# Patient Record
Sex: Female | Born: 1970
Health system: Southern US, Community
[De-identification: ages and names within clinical notes are randomized; demographics above are authoritative.]

## PROBLEM LIST (undated history)

## (undated) DIAGNOSIS — T4145XA Adverse effect of unspecified anesthetic, initial encounter: Secondary | ICD-10-CM

## (undated) DIAGNOSIS — D242 Benign neoplasm of left breast: Secondary | ICD-10-CM

## (undated) DIAGNOSIS — I10 Essential (primary) hypertension: Secondary | ICD-10-CM

## (undated) DIAGNOSIS — T8859XA Other complications of anesthesia, initial encounter: Secondary | ICD-10-CM

## (undated) DIAGNOSIS — D241 Benign neoplasm of right breast: Secondary | ICD-10-CM

## (undated) HISTORY — PX: BREAST SURGERY: SHX581

## (undated) HISTORY — DX: Essential (primary) hypertension: I10

## (undated) HISTORY — PX: BREAST BIOPSY: SHX20

---

## 2015-01-25 DIAGNOSIS — N6011 Diffuse cystic mastopathy of right breast: Secondary | ICD-10-CM | POA: Insufficient documentation

## 2015-01-25 DIAGNOSIS — N6012 Diffuse cystic mastopathy of left breast: Secondary | ICD-10-CM

## 2015-02-19 ENCOUNTER — Other Ambulatory Visit: Payer: Self-pay | Admitting: Internal Medicine

## 2015-02-19 DIAGNOSIS — N649 Disorder of breast, unspecified: Secondary | ICD-10-CM

## 2015-03-15 ENCOUNTER — Other Ambulatory Visit: Payer: Self-pay | Admitting: Internal Medicine

## 2015-03-15 ENCOUNTER — Ambulatory Visit
Admission: RE | Admit: 2015-03-15 | Discharge: 2015-03-15 | Disposition: A | Discharge status: Home / Self Care | Payer: 59 | Source: Ambulatory Visit | Attending: Internal Medicine | Admitting: Internal Medicine

## 2015-03-15 DIAGNOSIS — N649 Disorder of breast, unspecified: Secondary | ICD-10-CM

## 2015-03-21 ENCOUNTER — Other Ambulatory Visit: Payer: Self-pay | Admitting: Internal Medicine

## 2015-03-21 DIAGNOSIS — N649 Disorder of breast, unspecified: Secondary | ICD-10-CM

## 2015-03-29 ENCOUNTER — Ambulatory Visit
Admission: RE | Admit: 2015-03-29 | Discharge: 2015-03-29 | Disposition: A | Discharge status: Home / Self Care | Payer: 59 | Source: Ambulatory Visit | Attending: Internal Medicine | Admitting: Internal Medicine

## 2015-03-29 ENCOUNTER — Other Ambulatory Visit: Payer: Self-pay | Admitting: Internal Medicine

## 2015-03-29 DIAGNOSIS — N649 Disorder of breast, unspecified: Secondary | ICD-10-CM

## 2015-06-25 ENCOUNTER — Other Ambulatory Visit: Payer: Self-pay | Admitting: Psychiatry

## 2015-06-25 ENCOUNTER — Other Ambulatory Visit: Payer: Self-pay | Admitting: Internal Medicine

## 2015-06-25 DIAGNOSIS — N63 Unspecified lump in unspecified breast: Secondary | ICD-10-CM

## 2015-07-05 ENCOUNTER — Ambulatory Visit
Admission: RE | Admit: 2015-07-05 | Discharge: 2015-07-05 | Disposition: A | Discharge status: Home / Self Care | Payer: 59 | Source: Ambulatory Visit | Attending: Psychiatry | Admitting: Psychiatry

## 2015-07-05 ENCOUNTER — Ambulatory Visit
Admission: RE | Admit: 2015-07-05 | Discharge: 2015-07-05 | Disposition: A | Discharge status: Home / Self Care | Payer: 59 | Source: Ambulatory Visit | Attending: Internal Medicine | Admitting: Internal Medicine

## 2015-07-05 DIAGNOSIS — N63 Unspecified lump in unspecified breast: Secondary | ICD-10-CM

## 2015-08-30 ENCOUNTER — Other Ambulatory Visit: Payer: Self-pay | Admitting: Internal Medicine

## 2015-08-30 DIAGNOSIS — N6310 Unspecified lump in the right breast, unspecified quadrant: Secondary | ICD-10-CM

## 2015-09-20 ENCOUNTER — Other Ambulatory Visit: Payer: Self-pay | Admitting: Internal Medicine

## 2015-09-20 ENCOUNTER — Ambulatory Visit
Admission: RE | Admit: 2015-09-20 | Discharge: 2015-09-20 | Disposition: A | Discharge status: Home / Self Care | Payer: 59 | Source: Ambulatory Visit | Attending: Internal Medicine | Admitting: Internal Medicine

## 2015-09-20 DIAGNOSIS — N6310 Unspecified lump in the right breast, unspecified quadrant: Secondary | ICD-10-CM

## 2015-10-11 ENCOUNTER — Ambulatory Visit
Admission: RE | Admit: 2015-10-11 | Discharge: 2015-10-11 | Disposition: A | Discharge status: Home / Self Care | Payer: 59 | Source: Ambulatory Visit | Attending: Internal Medicine | Admitting: Internal Medicine

## 2015-10-11 ENCOUNTER — Other Ambulatory Visit: Payer: Self-pay | Admitting: Internal Medicine

## 2015-10-11 DIAGNOSIS — N6310 Unspecified lump in the right breast, unspecified quadrant: Secondary | ICD-10-CM

## 2015-11-08 DIAGNOSIS — D241 Benign neoplasm of right breast: Secondary | ICD-10-CM | POA: Diagnosis not present

## 2015-11-08 DIAGNOSIS — D242 Benign neoplasm of left breast: Secondary | ICD-10-CM | POA: Diagnosis not present

## 2015-11-23 DIAGNOSIS — H524 Presbyopia: Secondary | ICD-10-CM | POA: Diagnosis not present

## 2015-11-23 DIAGNOSIS — H52223 Regular astigmatism, bilateral: Secondary | ICD-10-CM | POA: Diagnosis not present

## 2015-11-23 DIAGNOSIS — H5213 Myopia, bilateral: Secondary | ICD-10-CM | POA: Diagnosis not present

## 2016-01-10 DIAGNOSIS — I83813 Varicose veins of bilateral lower extremities with pain: Secondary | ICD-10-CM | POA: Diagnosis not present

## 2016-01-13 ENCOUNTER — Ambulatory Visit: Payer: Self-pay | Admitting: Surgery

## 2016-01-13 DIAGNOSIS — D241 Benign neoplasm of right breast: Secondary | ICD-10-CM | POA: Diagnosis not present

## 2016-01-13 DIAGNOSIS — D242 Benign neoplasm of left breast: Secondary | ICD-10-CM | POA: Diagnosis not present

## 2016-01-13 NOTE — H&P (Signed)
History of Present Illness Shelley Davila. Shelley Geffrard MD; 01/13/2016 9:39 AM) Patient words: eval palpable mass.  The patient is a 45 year old female who presents with a breast mass. Three month follow-up for right breast masses. She has had at least 4 previous excisional biopsies and each one turned out to be a fibroadenoma. She has had multiple other core needle biopsies and has several biopsy clips in place on each side. She was examined June of 2016 and had a small left-sided fibroadenoma. This has not really changed. However in September, she was able to palpate a small mass just under the skin of her right nipple. This was biopsied on 10/11/15 and the pathology showed fibroadenoma versus a low-grade phyllodes tumor. She had another mass at 7:00 in the same breast that was palpated at the last visit. We decided to recheck in 3 months and rather than perform another lumpectomy. If there has been any enlargement that we will consider lumpectomy.  Menarche - 60 First pregnancy - 26 Breastfeed - yes Family history - none OCP - 10-12 years   CLINICAL DATA: Patient complains of an enlarging mass in the 1 o'clock region of the right breast. History of numerous fibroadenomas biopsied in the right breast and fibrocystic change biopsied from the left breast.  EXAM: DIGITAL DIAGNOSTIC BILATERAL MAMMOGRAM WITH 3D TOMOSYNTHESIS WITH CAD  ULTRASOUND BILATERAL BREAST  COMPARISON: Previous exam(s).  ACR Breast Density Category d: The breast tissue is extremely dense, which lowers the sensitivity of mammography.  FINDINGS: There is a developing mass in the anterior aspect of the upper-outer quadrant of the right breast corresponding with the palpable abnormality. No additional masses are seen in the right breast. There are no malignant type microcalcifications in either breast. The asymmetric fibroglandular tissue in the superior aspect of the left breast is stable when compared to prior exam  dated 09/20/2014.  Mammographic images were processed with CAD.  On physical exam, I palpate a freely mobile mass in the right breast at 11 o'clock 1 cm from the nipple. I do not palpate a mass in the superior aspect of the left breast.  Targeted ultrasound is performed, showing a well-circumscribed hypoechoic mass in the right breast at 11 o'clock 1 cm from the nipple measuring 9 x 8 x 10 mm. On the prior ultrasound dated 07/05/2015 it measured 6 x 6 x 6 mm. Sonographic evaluation of the right axilla does not show any enlarged adenopathy. Sonographic evaluation of the superior aspect of the left breast shows an anechoic cyst at 2 o'clock 3 cm from the nipple measuring 5 x 4 x 5 mm. No solid mass or abnormal shadowing detected.  IMPRESSION: Developing mass in the 11 o'clock region of the right breast.  RECOMMENDATION: Ultrasound-guided core biopsy of the right breast mass is recommended. This will be scheduled at the patient's convenience.  I have discussed the findings and recommendations with the patient. Results were also provided in writing at the conclusion of the visit. If applicable, a reminder letter will be sent to the patient regarding the next appointment.  BI-RADS CATEGORY 4: Suspicious.   Electronically Signed By: Lillia Mountain M.D. On: 09/20/2015 16:52  CLINICAL DATA: Patient presents for ultrasound-guided core needle biopsy of a palpable right breast mass, which has significantly enlarged. She has a history of multiple previous biopsies both excisions and needle biopsies, revealing multiple bilateral fibroadenomas.  EXAM: ULTRASOUND GUIDED RIGHT BREAST CORE NEEDLE BIOPSY  COMPARISON: Previous exam(s).  FINDINGS: I met with the patient  and we discussed the procedure of ultrasound-guided biopsy, including benefits and alternatives. We discussed the high likelihood of a successful procedure. We discussed the risks of the procedure, including infection,  bleeding, tissue injury, clip migration, and inadequate sampling. Informed written consent was given. The usual time-out protocol was performed immediately prior to the procedure.  Using sterile technique and 2% Lidocaine as local anesthetic, under direct ultrasound visualization, a 14 gauge spring-loaded device was used to perform biopsy of the small right breast mass using a inferior-lateral approach. At the conclusion of the procedure a coil shaped tissue marker clip was deployed into the biopsy cavity. Follow up 2 view mammogram was performed and dictated separately.  IMPRESSION: Ultrasound guided biopsy of right breast mass. No apparent complications.  Electronically Signed: By: Lajean Manes M.D. On: 10/11/2015 15:59   Diagnosis Breast, right, needle core biopsy, 11 o'clock near nipple - BIPHASIC EPITHELIAL / STROMAL LESION. - USUAL DUCTAL HYPERPLASIA. - SEE COMMENT. Microscopic Comment The differential diagnosis includes a fibroadenoma versus a low grade phyllodes tumor. The findings are called to the [facility] on 10/15/15. Dr. Lyndon Code has seen this case in consultation with agreement. (RH:ds 10/15/15) [name] MD Pathologist, Electronic Signature (Case signed 10/15/2015)   Allergies (Ammie Eversole, LPN; 10/17/1094 0:45 AM) No Known Drug Allergies 11/08/2015  Medication History (Ammie Eversole, LPN; 4/0/9811 9:14 AM) No Current Medications Medications Reconciled    Vitals (Ammie Eversole LPN; 04/19/2955 2:13 AM) 01/13/2016 8:29 AM Weight: 137.4 lb Height: 67in Body Surface Area: 1.72 m Body Mass Index: 21.52 kg/m  Temp.: 98.15F(Oral)  Pulse: 72 (Regular)  BP: 102/70 (Sitting, Left Arm, Standard)      Physical Exam Rodman Key K. Selyna Klahn MD; 01/13/2016 9:40 AM)  The physical exam findings are as follows: Note:WDWN in NAD HEENT: EOMI, sclera anicteric Neck: No masses, no thyromegaly Lungs: CTA bilaterally; normal respiratory  effort Breasts: symmetric; slight ptosis; right retroareolar mass - larger - now measuring about 1.5 cm with some extension laterally; right under the skin Right breast 7:00 - 1 cm oval-shaped mass; no change from last visit CV: Regular rate and rhythm; no murmurs Abd: +bowel sounds, soft, non-tender, no masses Ext: Well-perfused; no edema Skin: Warm, dry; no sign of jaundice    Assessment & Plan Rodman Key K. Genean Adamski MD; 01/13/2016 9:05 AM)  MULTIPLE FIBROADENOMAS OF BOTH BREASTS (D24.1)  Current Plans Schedule for Surgery - Excision of two fibroadenomas - right breast (11:00 and 7:00). The surgical procedure has been discussed with the patient. Potential risks, benefits, alternative treatments, and expected outcomes have been explained. All of the patient's questions at this time have been answered. The likelihood of reaching the patient's treatment goal is good. The patient understand the proposed surgical procedure and wishes to proceed.  Shelley Davila. Georgette Dover, MD, [facility] Surgery  General/ Trauma Surgery  01/13/2016 9:43 AM

## 2016-02-28 ENCOUNTER — Encounter (HOSPITAL_BASED_OUTPATIENT_CLINIC_OR_DEPARTMENT_OTHER): Payer: Self-pay | Admitting: *Deleted

## 2016-03-03 NOTE — Pre-Procedure Instructions (Signed)
Pt in to pick up Boost Breeze. Instructions reviewed with pt.

## 2016-03-05 ENCOUNTER — Ambulatory Visit (HOSPITAL_BASED_OUTPATIENT_CLINIC_OR_DEPARTMENT_OTHER)
Admission: RE | Admit: 2016-03-05 | Discharge: 2016-03-05 | Disposition: A | Discharge status: Home / Self Care | Payer: 59 | Source: Ambulatory Visit | Attending: Surgery | Admitting: Surgery

## 2016-03-05 ENCOUNTER — Encounter (HOSPITAL_BASED_OUTPATIENT_CLINIC_OR_DEPARTMENT_OTHER): Payer: Self-pay

## 2016-03-05 ENCOUNTER — Ambulatory Visit (HOSPITAL_BASED_OUTPATIENT_CLINIC_OR_DEPARTMENT_OTHER): Payer: 59 | Admitting: Anesthesiology

## 2016-03-05 ENCOUNTER — Encounter (HOSPITAL_BASED_OUTPATIENT_CLINIC_OR_DEPARTMENT_OTHER)
Admission: RE | Disposition: A | Discharge status: Home / Self Care | Payer: Self-pay | Source: Ambulatory Visit | Attending: Surgery

## 2016-03-05 DIAGNOSIS — D241 Benign neoplasm of right breast: Secondary | ICD-10-CM | POA: Insufficient documentation

## 2016-03-05 DIAGNOSIS — N6081 Other benign mammary dysplasias of right breast: Secondary | ICD-10-CM | POA: Diagnosis not present

## 2016-03-05 DIAGNOSIS — N6011 Diffuse cystic mastopathy of right breast: Secondary | ICD-10-CM | POA: Diagnosis not present

## 2016-03-05 HISTORY — DX: Adverse effect of unspecified anesthetic, initial encounter: T41.45XA

## 2016-03-05 HISTORY — DX: Benign neoplasm of right breast: D24.1

## 2016-03-05 HISTORY — PX: EXCISION OF BREAST BIOPSY: SHX5822

## 2016-03-05 HISTORY — DX: Benign neoplasm of right breast: D24.2

## 2016-03-05 HISTORY — DX: Other complications of anesthesia, initial encounter: T88.59XA

## 2016-03-05 SURGERY — EXCISION OF BREAST BIOPSY
Anesthesia: General | Site: Breast | Laterality: Right

## 2016-03-05 MED ORDER — DEXAMETHASONE SODIUM PHOSPHATE 4 MG/ML IJ SOLN
INTRAMUSCULAR | Status: DC | PRN
  Administered 2016-03-05: 10 mg via INTRAVENOUS

## 2016-03-05 MED ORDER — BUPIVACAINE-EPINEPHRINE 0.25% -1:200000 IJ SOLN
INTRAMUSCULAR | Status: DC | PRN
  Administered 2016-03-05: 7 mL

## 2016-03-05 MED ORDER — CEFAZOLIN SODIUM-DEXTROSE 2-4 GM/100ML-% IV SOLN
INTRAVENOUS | Status: AC
  Filled 2016-03-05: qty 100

## 2016-03-05 MED ORDER — SCOPOLAMINE 1 MG/3DAYS TD PT72: 1.0000 | MEDICATED_PATCH | Freq: Once | TRANSDERMAL | Status: DC | PRN

## 2016-03-05 MED ORDER — MIDAZOLAM HCL 2 MG/2ML IJ SOLN: 1.0000 mg | INTRAMUSCULAR | Status: DC | PRN

## 2016-03-05 MED ORDER — GLYCOPYRROLATE 0.2 MG/ML IV SOSY
PREFILLED_SYRINGE | INTRAVENOUS | Status: AC
  Filled 2016-03-05: qty 3

## 2016-03-05 MED ORDER — GLYCOPYRROLATE 0.2 MG/ML IJ SOLN: 0.2000 mg | Freq: Once | INTRAMUSCULAR | Status: DC | PRN

## 2016-03-05 MED ORDER — ONDANSETRON HCL 4 MG/2ML IJ SOLN
INTRAMUSCULAR | Status: DC | PRN
  Administered 2016-03-05: 4 mg via INTRAVENOUS

## 2016-03-05 MED ORDER — MEPERIDINE HCL 25 MG/ML IJ SOLN: 6.2500 mg | INTRAMUSCULAR | Status: DC | PRN

## 2016-03-05 MED ORDER — BUPIVACAINE-EPINEPHRINE (PF) 0.25% -1:200000 IJ SOLN
INTRAMUSCULAR | Status: AC
  Filled 2016-03-05: qty 60

## 2016-03-05 MED ORDER — FENTANYL CITRATE (PF) 100 MCG/2ML IJ SOLN
INTRAMUSCULAR | Status: DC | PRN
  Administered 2016-03-05 (×2): 50 ug via INTRAVENOUS

## 2016-03-05 MED ORDER — HYDROMORPHONE HCL 1 MG/ML IJ SOLN: 0.2500 mg | INTRAMUSCULAR | Status: DC | PRN

## 2016-03-05 MED ORDER — SCOPOLAMINE 1 MG/3DAYS TD PT72
MEDICATED_PATCH | TRANSDERMAL | Status: AC
  Filled 2016-03-05: qty 1

## 2016-03-05 MED ORDER — LIDOCAINE HCL (PF) 1 % IJ SOLN
INTRAMUSCULAR | Status: AC
  Filled 2016-03-05: qty 30

## 2016-03-05 MED ORDER — LIDOCAINE HCL (CARDIAC) 20 MG/ML IV SOLN
INTRAVENOUS | Status: DC | PRN
  Administered 2016-03-05: 50 mg via INTRAVENOUS

## 2016-03-05 MED ORDER — PHENYLEPHRINE HCL 10 MG/ML IJ SOLN
INTRAMUSCULAR | Status: DC | PRN
  Administered 2016-03-05: 80 ug via INTRAVENOUS

## 2016-03-05 MED ORDER — MIDAZOLAM HCL 5 MG/5ML IJ SOLN
INTRAMUSCULAR | Status: DC | PRN
  Administered 2016-03-05: 2 mg via INTRAVENOUS

## 2016-03-05 MED ORDER — PROPOFOL 10 MG/ML IV BOLUS
INTRAVENOUS | Status: DC | PRN
  Administered 2016-03-05: 200 mg via INTRAVENOUS
  Administered 2016-03-05: 100 mg via INTRAVENOUS

## 2016-03-05 MED ORDER — LACTATED RINGERS IV SOLN
INTRAVENOUS | Status: DC
  Administered 2016-03-05 (×2): via INTRAVENOUS

## 2016-03-05 MED ORDER — ONDANSETRON HCL 4 MG/2ML IJ SOLN: 4.0000 mg | INTRAMUSCULAR | Status: DC | PRN

## 2016-03-05 MED ORDER — CEFAZOLIN SODIUM-DEXTROSE 2-4 GM/100ML-% IV SOLN
2.0000 g | INTRAVENOUS | Status: AC
  Administered 2016-03-05: 2 g via INTRAVENOUS

## 2016-03-05 MED ORDER — LIDOCAINE 2% (20 MG/ML) 5 ML SYRINGE
INTRAMUSCULAR | Status: AC
  Filled 2016-03-05: qty 5

## 2016-03-05 MED ORDER — SODIUM BICARBONATE 4 % IV SOLN
INTRAVENOUS | Status: AC
  Filled 2016-03-05: qty 5

## 2016-03-05 MED ORDER — HYDROCODONE-ACETAMINOPHEN 5-325 MG PO TABS: 1.0000 | ORAL_TABLET | ORAL | Status: DC | PRN

## 2016-03-05 MED ORDER — GLYCOPYRROLATE 0.2 MG/ML IJ SOLN
INTRAMUSCULAR | Status: DC | PRN
  Administered 2016-03-05: 0.2 mg via INTRAVENOUS

## 2016-03-05 MED ORDER — METHYLENE BLUE 0.5 % INJ SOLN
INTRAVENOUS | Status: AC
  Filled 2016-03-05: qty 10

## 2016-03-05 MED ORDER — SCOPOLAMINE 1 MG/3DAYS TD PT72
MEDICATED_PATCH | TRANSDERMAL | Status: DC | PRN
  Administered 2016-03-05: 1 via TRANSDERMAL

## 2016-03-05 MED ORDER — ONDANSETRON HCL 4 MG/2ML IJ SOLN
INTRAMUSCULAR | Status: AC
  Filled 2016-03-05: qty 2

## 2016-03-05 MED ORDER — MIDAZOLAM HCL 2 MG/2ML IJ SOLN
INTRAMUSCULAR | Status: AC
  Filled 2016-03-05: qty 2

## 2016-03-05 MED ORDER — FENTANYL CITRATE (PF) 100 MCG/2ML IJ SOLN: 50.0000 ug | INTRAMUSCULAR | Status: DC | PRN

## 2016-03-05 MED ORDER — MORPHINE SULFATE (PF) 2 MG/ML IV SOLN: 2.0000 mg | INTRAVENOUS | Status: DC | PRN

## 2016-03-05 MED ORDER — DEXAMETHASONE SODIUM PHOSPHATE 10 MG/ML IJ SOLN
INTRAMUSCULAR | Status: AC
  Filled 2016-03-05: qty 1

## 2016-03-05 MED ORDER — OXYCODONE HCL 5 MG/5ML PO SOLN: 5.0000 mg | Freq: Once | ORAL | Status: DC | PRN

## 2016-03-05 MED ORDER — CHLORHEXIDINE GLUCONATE 4 % EX LIQD: 1.0000 "application " | Freq: Once | CUTANEOUS | Status: DC

## 2016-03-05 MED ORDER — PROPOFOL 10 MG/ML IV BOLUS
INTRAVENOUS | Status: AC
  Filled 2016-03-05: qty 40

## 2016-03-05 MED ORDER — SODIUM CHLORIDE 0.9 % IJ SOLN
INTRAMUSCULAR | Status: AC
  Filled 2016-03-05: qty 10

## 2016-03-05 MED ORDER — PHENYLEPHRINE 40 MCG/ML (10ML) SYRINGE FOR IV PUSH (FOR BLOOD PRESSURE SUPPORT)
PREFILLED_SYRINGE | INTRAVENOUS | Status: AC
  Filled 2016-03-05: qty 10

## 2016-03-05 MED ORDER — OXYCODONE HCL 5 MG PO TABS: 5.0000 mg | ORAL_TABLET | Freq: Once | ORAL | Status: DC | PRN

## 2016-03-05 MED ORDER — BUPIVACAINE HCL (PF) 0.5 % IJ SOLN
INTRAMUSCULAR | Status: AC
  Filled 2016-03-05: qty 30

## 2016-03-05 MED ORDER — FENTANYL CITRATE (PF) 100 MCG/2ML IJ SOLN
INTRAMUSCULAR | Status: AC
  Filled 2016-03-05: qty 2

## 2016-03-05 MED FILL — HYDROCODON-APAP 5-325: 5-325 | 7 days supply | Qty: 40 | Fill #0

## 2016-03-05 SURGICAL SUPPLY — 45 items
APPLICATOR COTTON TIP 6IN STRL (MISCELLANEOUS) IMPLANT
APPLIER CLIP 9.375 MED OPEN (MISCELLANEOUS)
BENZOIN TINCTURE PRP APPL 2/3 (GAUZE/BANDAGES/DRESSINGS) ×2 IMPLANT
BLADE HEX COATED 2.75 (ELECTRODE) ×2 IMPLANT
BLADE SURG 15 STRL LF DISP TIS (BLADE) ×1 IMPLANT
BLADE SURG 15 STRL SS (BLADE) ×1
CANISTER SUCT 1200ML W/VALVE (MISCELLANEOUS) ×2 IMPLANT
CHLORAPREP W/TINT 26ML (MISCELLANEOUS) ×2 IMPLANT
CLIP APPLIE 9.375 MED OPEN (MISCELLANEOUS) IMPLANT
COVER BACK TABLE 60X90IN (DRAPES) ×2 IMPLANT
COVER MAYO STAND STRL (DRAPES) ×2 IMPLANT
DECANTER SPIKE VIAL GLASS SM (MISCELLANEOUS) ×2 IMPLANT
DEVICE DUBIN W/COMP PLATE 8390 (MISCELLANEOUS) IMPLANT
DRAPE LAPAROTOMY 100X72 PEDS (DRAPES) ×2 IMPLANT
DRAPE UTILITY XL STRL (DRAPES) ×2 IMPLANT
DRSG TEGADERM 4X4.75 (GAUZE/BANDAGES/DRESSINGS) ×2 IMPLANT
ELECT REM PT RETURN 9FT ADLT (ELECTROSURGICAL) ×2
ELECTRODE REM PT RTRN 9FT ADLT (ELECTROSURGICAL) ×1 IMPLANT
GLOVE BIO SURGEON STRL SZ 6.5 (GLOVE) ×2 IMPLANT
GLOVE BIO SURGEON STRL SZ7 (GLOVE) ×2 IMPLANT
GLOVE BIOGEL PI IND STRL 7.5 (GLOVE) ×2 IMPLANT
GLOVE BIOGEL PI INDICATOR 7.5 (GLOVE) ×2
GLOVE EXAM NITRILE EXT CUFF MD (GLOVE) ×2 IMPLANT
GLOVE SURG SS PI 7.0 STRL IVOR (GLOVE) ×4 IMPLANT
GOWN STRL REUS W/ TWL LRG LVL3 (GOWN DISPOSABLE) ×2 IMPLANT
GOWN STRL REUS W/TWL LRG LVL3 (GOWN DISPOSABLE) ×2
KIT MARKER MARGIN INK (KITS) ×2 IMPLANT
NEEDLE HYPO 25X1 1.5 SAFETY (NEEDLE) ×2 IMPLANT
NS IRRIG 1000ML POUR BTL (IV SOLUTION) ×2 IMPLANT
PACK BASIN DAY SURGERY FS (CUSTOM PROCEDURE TRAY) ×2 IMPLANT
PENCIL BUTTON HOLSTER BLD 10FT (ELECTRODE) ×2 IMPLANT
SLEEVE SCD COMPRESS KNEE MED (MISCELLANEOUS) ×2 IMPLANT
SPONGE GAUZE 4X4 12PLY STER LF (GAUZE/BANDAGES/DRESSINGS) ×2 IMPLANT
SPONGE LAP 4X18 X RAY DECT (DISPOSABLE) ×2 IMPLANT
STRIP CLOSURE SKIN 1/2X4 (GAUZE/BANDAGES/DRESSINGS) ×2 IMPLANT
SUT CHROMIC 3 0 SH 27 (SUTURE) IMPLANT
SUT MON AB 4-0 PC3 18 (SUTURE) ×2 IMPLANT
SUT SILK 2 0 SH (SUTURE) IMPLANT
SUT VIC AB 3-0 SH 27 (SUTURE) ×1
SUT VIC AB 3-0 SH 27X BRD (SUTURE) ×1 IMPLANT
SYR CONTROL 10ML LL (SYRINGE) ×2 IMPLANT
TOWEL OR 17X24 6PK STRL BLUE (TOWEL DISPOSABLE) ×2 IMPLANT
TOWEL OR NON WOVEN STRL DISP B (DISPOSABLE) ×2 IMPLANT
TUBE CONNECTING 20X1/4 (TUBING) ×2 IMPLANT
YANKAUER SUCT BULB TIP NO VENT (SUCTIONS) ×2 IMPLANT

## 2016-03-05 NOTE — Discharge Instructions (Signed)
[facility] °Office Phone Number [phone] ° °BREAST BIOPSY/ PARTIAL MASTECTOMY: POST OP INSTRUCTIONS ° °Always review your discharge instruction sheet given to you by the facility where your surgery was performed. ° °IF YOU HAVE DISABILITY OR FAMILY LEAVE FORMS, YOU MUST BRING THEM TO THE OFFICE FOR PROCESSING.  DO NOT GIVE THEM TO YOUR DOCTOR. ° °1. A prescription for pain medication may be given to you upon discharge.  Take your pain medication as prescribed, if needed.  If narcotic pain medicine is not needed, then you may take acetaminophen (Tylenol) or ibuprofen (Advil) as needed. °2. Take your usually prescribed medications unless otherwise directed °3. If you need a refill on your pain medication, please contact your pharmacy.  They will contact our office to request authorization.  Prescriptions will not be filled after 5pm or on week-ends. °4. You should eat very light the first 24 hours after surgery, such as soup, crackers, pudding, etc.  Resume your normal diet the day after surgery. °5. Most patients will experience some swelling and bruising in the breast.  Ice packs and a good support bra will help.  Swelling and bruising can take several days to resolve.  °6. It is common to experience some constipation if taking pain medication after surgery.  Increasing fluid intake and taking a stool softener will usually help or prevent this problem from occurring.  A mild laxative (Milk of Magnesia or Miralax) should be taken according to package directions if there are no bowel movements after 48 hours. °7. Unless discharge instructions indicate otherwise, you may remove your bandages 48 hours after surgery, and you may shower at that time.  You will have steri-strips (small skin tapes) in place directly over the incision.  These strips should be left on the skin for 7-10 days.   Any sutures or staples will be removed at the office during your follow-up visit. °8. ACTIVITIES:  You may resume  regular daily activities (gradually increasing) beginning the next day.  Wearing a good support bra or sports bra minimizes pain and swelling.  You may have sexual intercourse when it is comfortable. °a. You may drive when you no longer are taking prescription pain medication, you can comfortably wear a seatbelt, and you can safely maneuver your car and apply brakes. °b. RETURN TO WORK:  1-2 weeks °9. You should see your doctor in the office for a follow-up appointment approximately two weeks after your surgery.  Your doctor’s nurse will typically make your follow-up appointment when she calls you with your pathology report.  Expect your pathology report 2-3 business days after your surgery.  You may call to check if you do not hear from us after three days. °10. OTHER INSTRUCTIONS: _______________________________________________________________________________________________ _____________________________________________________________________________________________________________________________________ °_____________________________________________________________________________________________________________________________________ °_____________________________________________________________________________________________________________________________________ ° °WHEN TO CALL YOUR DOCTOR: °1. Fever over 101.0 °2. Nausea and/or vomiting. °3. Extreme swelling or bruising. °4. Continued bleeding from incision. °5. Increased pain, redness, or drainage from the incision. ° °The clinic staff is available to answer your questions during regular business hours.  Please don’t hesitate to call and ask to speak to one of the nurses for clinical concerns.  If you have a medical emergency, go to the nearest emergency room or call 911.  A surgeon from [facility] is always on call at the hospital. ° °For further questions, please visit [email or web address]  ° ° ° °Post Anesthesia Home Care  Instructions ° °Activity: °Get plenty of rest for the remainder of the day. A responsible adult should stay   with you for 24 hours following the procedure.  °For the next 24 hours, DO NOT: °-Drive a car °-Operate machinery °-Drink alcoholic beverages °-Take any medication unless instructed by your physician °-Make any legal decisions or sign important papers. ° °Meals: °Start with liquid foods such as gelatin or soup. Progress to regular foods as tolerated. Avoid greasy, spicy, heavy foods. If nausea and/or vomiting occur, drink only clear liquids until the nausea and/or vomiting subsides. Call your physician if vomiting continues. ° °Special Instructions/Symptoms: °Your throat may feel dry or sore from the anesthesia or the breathing tube placed in your throat during surgery. If this causes discomfort, gargle with warm salt water. The discomfort should disappear within 24 hours. ° °If you had a scopolamine patch placed behind your ear for the management of post- operative nausea and/or vomiting: ° °1. The medication in the patch is effective for 72 hours, after which it should be removed.  Wrap patch in a tissue and discard in the trash. Wash hands thoroughly with soap and water. °2. You may remove the patch earlier than 72 hours if you experience unpleasant side effects which may include dry mouth, dizziness or visual disturbances. °3. Avoid touching the patch. Wash your hands with soap and water after contact with the patch. °  ° °

## 2016-03-05 NOTE — Op Note (Signed)
Preop diagnosis: Multiple right breast fibroadenomas Postop diagnosis: Same Procedure performed: Excision of 2 right breast fibroadenomas Surgeon:Braydee Shimkus K. Anesthesia: Gen. via LMA Indications: This is a 45 year old female who has had multiple excisional biopsies for fibroadenomas. She has developed 2 new areas in the right breast. We have followed his over last several months and they seem to be enlarging. There is one located at 11:00 just at the edge of the areola. There is another at 7:00 about 2 cm lateral to the edge of the areole. We recommended lumpectomy both of these.  Description of procedure: The patient is brought to the operating room and placed in a supine position on the operating room table. After an adequate level of general anesthesia was obtained, her right breast was prepped with ChloraPrep and draped sterile fashion. A timeout was taken to ensure the proper patient and proper procedure. We infiltrated the area around the lateral side of the nipple with 0.25% Marcaine with epinephrine. She has a previous circumareolar incision. We open this with the scalpel. Dissection was carried down in the breast tissue with cautery. We excised the small firm mass that 11:00 with cautery. This was oriented with a paint kit. This was sent for pathologic examination labeled right breast mass 11:00.  We then palpated the mass at 7:00. We dissected down to the surface of this mass. We excised this mass. There seems to be a couple of cyst containing some bluish fluid. There is a small firm mass in the center of this area. We excised this and oriented with a paint kit. This was sent for pathologic examination labeled right breast mass 7:00. We irrigated the wound thoroughly and inspected for hemostasis. The wound was closed with a deep layer of 3-0 Vicryl and a subcuticular layer of 4-0 Monocryl. Steri-Strips and clean dressings were applied. The patient was then extubated and brought to the recovery  room in  stable condition. All sponge, initially, and needle counts are correct.  Imogene Burn. Georgette Dover, MD, [facility]  General/ Trauma Surgery  03/05/2016 8:32 AM

## 2016-03-05 NOTE — H&P (Signed)
History of Present Illness  Patient words: eval palpable mass.  The patient is a 45 year old female who presents with a breast mass. Three month follow-up for right breast masses. She has had at least 4 previous excisional biopsies and each one turned out to be a fibroadenoma. She has had multiple other core needle biopsies and has several biopsy clips in place on each side. She was examined June of 2016 and had a small left-sided fibroadenoma. This has not really changed. However in September, she was able to palpate a small mass just under the skin of her right nipple. This was biopsied on 10/11/15 and the pathology showed fibroadenoma versus a low-grade phyllodes tumor. She had another mass at 7:00 in the same breast that was palpated at the last visit. We decided to recheck in 3 months and rather than perform another lumpectomy. If there has been any enlargement that we will consider lumpectomy.  Menarche - 24 First pregnancy - 26 Breastfeed - yes Family history - none OCP - 10-12 years   CLINICAL DATA: Patient complains of an enlarging mass in the 1 o'clock region of the right breast. History of numerous fibroadenomas biopsied in the right breast and fibrocystic change biopsied from the left breast.  EXAM: DIGITAL DIAGNOSTIC BILATERAL MAMMOGRAM WITH 3D TOMOSYNTHESIS WITH CAD  ULTRASOUND BILATERAL BREAST  COMPARISON: Previous exam(s).  ACR Breast Density Category d: The breast tissue is extremely dense, which lowers the sensitivity of mammography.  FINDINGS: There is a developing mass in the anterior aspect of the upper-outer quadrant of the right breast corresponding with the palpable abnormality. No additional masses are seen in the right breast. There are no malignant type microcalcifications in either breast. The asymmetric fibroglandular tissue in the superior aspect of the left breast is stable when compared to prior exam dated 09/20/2014.  Mammographic  images were processed with CAD.  On physical exam, I palpate a freely mobile mass in the right breast at 11 o'clock 1 cm from the nipple. I do not palpate a mass in the superior aspect of the left breast.  Targeted ultrasound is performed, showing a well-circumscribed hypoechoic mass in the right breast at 11 o'clock 1 cm from the nipple measuring 9 x 8 x 10 mm. On the prior ultrasound dated 07/05/2015 it measured 6 x 6 x 6 mm. Sonographic evaluation of the right axilla does not show any enlarged adenopathy. Sonographic evaluation of the superior aspect of the left breast shows an anechoic cyst at 2 o'clock 3 cm from the nipple measuring 5 x 4 x 5 mm. No solid mass or abnormal shadowing detected.  IMPRESSION: Developing mass in the 11 o'clock region of the right breast.  RECOMMENDATION: Ultrasound-guided core biopsy of the right breast mass is recommended. This will be scheduled at the patient's convenience.  I have discussed the findings and recommendations with the patient. Results were also provided in writing at the conclusion of the visit. If applicable, a reminder letter will be sent to the patient regarding the next appointment.  BI-RADS CATEGORY 4: Suspicious.   Electronically Signed By: Lillia Mountain M.D. On: 09/20/2015 16:52  CLINICAL DATA: Patient presents for ultrasound-guided core needle biopsy of a palpable right breast mass, which has significantly enlarged. She has a history of multiple previous biopsies both excisions and needle biopsies, revealing multiple bilateral fibroadenomas.  EXAM: ULTRASOUND GUIDED RIGHT BREAST CORE NEEDLE BIOPSY  COMPARISON: Previous exam(s).  FINDINGS: I met with the patient and we discussed the procedure of  ultrasound-guided biopsy, including benefits and alternatives. We discussed the high likelihood of a successful procedure. We discussed the risks of the procedure, including infection, bleeding, tissue injury, clip  migration, and inadequate sampling. Informed written consent was given. The usual time-out protocol was performed immediately prior to the procedure.  Using sterile technique and 2% Lidocaine as local anesthetic, under direct ultrasound visualization, a 14 gauge spring-loaded device was used to perform biopsy of the small right breast mass using a inferior-lateral approach. At the conclusion of the procedure a coil shaped tissue marker clip was deployed into the biopsy cavity. Follow up 2 view mammogram was performed and dictated separately.  IMPRESSION: Ultrasound guided biopsy of right breast mass. No apparent complications.  Electronically Signed: By: Lajean Manes M.D. On: 10/11/2015 15:59   Diagnosis Breast, right, needle core biopsy, 11 o'clock near nipple - BIPHASIC EPITHELIAL / STROMAL LESION. - USUAL DUCTAL HYPERPLASIA. - SEE COMMENT. Microscopic Comment The differential diagnosis includes a fibroadenoma versus a low grade phyllodes tumor. The findings are called to the [facility] on 10/15/15. Dr. Lyndon Code has seen this case in consultation with agreement. (RH:ds 10/15/15) [name] MD Pathologist, Electronic Signature (Case signed 10/15/2015)   Allergies  No Known Drug Allergies 11/08/2015  Medication History  No Current Medications Medications Reconciled    Vitals  Weight: 137.4 lb Height: 67in Body Surface Area: 1.72 m Body Mass Index: 21.52 kg/m  Temp.: 98.83F(Oral)  Pulse: 72 (Regular)  BP: 102/70 (Sitting, Left Arm, Standard)      Physical Exam   The physical exam findings are as follows: Note:WDWN in NAD HEENT: EOMI, sclera anicteric Neck: No masses, no thyromegaly Lungs: CTA bilaterally; normal respiratory effort Breasts: symmetric; slight ptosis; right retroareolar mass - larger - now measuring about 1.5 cm with some extension laterally; right under the skin Right breast 7:00 - 1 cm oval-shaped mass; no  change from last visit CV: Regular rate and rhythm; no murmurs Abd: +bowel sounds, soft, non-tender, no masses Ext: Well-perfused; no edema Skin: Warm, dry; no sign of jaundice    Assessment & Plan   MULTIPLE FIBROADENOMAS OF BOTH BREASTS (D24.1)  Current Plans Schedule for Surgery - Excision of two fibroadenomas - right breast (11:00 and 7:00). The surgical procedure has been discussed with the patient. Potential risks, benefits, alternative treatments, and expected outcomes have been explained. All of the patient's questions at this time have been answered. The likelihood of reaching the patient's treatment goal is good. The patient understand the proposed surgical procedure and wishes to proceed.   Imogene Burn. Georgette Dover, MD, [facility] Surgery  General/ Trauma Surgery  03/05/2016 7:11 AM

## 2016-03-05 NOTE — Anesthesia Procedure Notes (Signed)
Procedure Name: LMA Insertion Date/Time: 03/05/2016 7:41 AM Performed by: Toula Moos L Pre-anesthesia Checklist: Patient identified, Emergency Drugs available, Suction available, Patient being monitored and Timeout performed Patient Re-evaluated:Patient Re-evaluated prior to inductionOxygen Delivery Method: Circle System Utilized Preoxygenation: Pre-oxygenation with 100% oxygen Intubation Type: IV induction Ventilation: Mask ventilation without difficulty LMA: LMA inserted LMA Size: 4.0 Number of attempts: 1 Airway Equipment and Method: Bite block Placement Confirmation: positive ETCO2 Tube secured with: Tape Dental Injury: Teeth and Oropharynx as per pre-operative assessment

## 2016-03-05 NOTE — Anesthesia Postprocedure Evaluation (Signed)
Anesthesia Post Note  Patient: Shelley Davila  Procedure(s) Performed: Procedure(s) (LRB): EXCISION OF TWO FIBROADENOMAS RIGHT  BREAST  (Right)  Patient location during evaluation: PACU Anesthesia Type: General Level of consciousness: awake and alert Pain management: pain level controlled Vital Signs Assessment: post-procedure vital signs reviewed and stable Respiratory status: spontaneous breathing, nonlabored ventilation and respiratory function stable Cardiovascular status: blood pressure returned to baseline and stable Postop Assessment: no signs of nausea or vomiting Anesthetic complications: no    Last Vitals:  Filed Vitals:   03/05/16 0900 03/05/16 0915  BP: 110/65 111/78  Pulse: 63 56  Temp:    Resp: 11 9    Last Pain:  Filed Vitals:   03/05/16 0923  PainSc: 2                  [name] A

## 2016-03-05 NOTE — Anesthesia Preprocedure Evaluation (Signed)
Anesthesia Evaluation  Patient identified by MRN, date of birth, ID band Patient awake    Reviewed: Allergy & Precautions, NPO status , Patient's Chart, lab work & pertinent test results  History of Anesthesia Complications (+) PONV  Airway Mallampati: I  TM Distance: >3 FB Neck ROM: Full    Dental  (+) Teeth Intact, Dental Advisory Given   Pulmonary  breath sounds clear to auscultation        Cardiovascular Rhythm:Regular Rate:Normal     Neuro/Psych    GI/Hepatic   Endo/Other    Renal/GU      Musculoskeletal   Abdominal   Peds  Hematology   Anesthesia Other Findings   Reproductive/Obstetrics                             Anesthesia Physical Anesthesia Plan  ASA: II  Anesthesia Plan: General   Post-op Pain Management:    Induction: Intravenous  Airway Management Planned: LMA  Additional Equipment:   Intra-op Plan:   Post-operative Plan: Extubation in OR  Informed Consent: I have reviewed the patients History and Physical, chart, labs and discussed the procedure including the risks, benefits and alternatives for the proposed anesthesia with the patient or authorized representative who has indicated his/her understanding and acceptance.   Dental advisory given  Plan Discussed with: CRNA, Anesthesiologist and Surgeon  Anesthesia Plan Comments:         Anesthesia Quick Evaluation  

## 2016-03-05 NOTE — Transfer of Care (Signed)
Immediate Anesthesia Transfer of Care Note  Patient: Shelley Davila  Procedure(s) Performed: Procedure(s): EXCISION OF TWO FIBROADENOMAS RIGHT  BREAST  (Right)  Patient Location: PACU  Anesthesia Type:General  Level of Consciousness: sedated  Airway & Oxygen Therapy: Patient Spontanous Breathing and Patient connected to face mask oxygen  Post-op Assessment: Report given to RN and Post -op Vital signs reviewed and stable  Post vital signs: Reviewed and stable  Last Vitals:  Filed Vitals:   03/05/16 0648  BP: 123/44  Pulse: 55  Temp: 36.4 C  Resp: 16    Last Pain:  Filed Vitals:   03/05/16 0650  PainSc: 5       Patients Stated Pain Goal: 0 ([id number])  Complications: No apparent anesthesia complications

## 2016-03-06 ENCOUNTER — Encounter (HOSPITAL_BASED_OUTPATIENT_CLINIC_OR_DEPARTMENT_OTHER): Payer: Self-pay | Admitting: Surgery

## 2016-05-01 DIAGNOSIS — Z0000 Encounter for general adult medical examination without abnormal findings: Secondary | ICD-10-CM | POA: Diagnosis not present

## 2016-12-19 DIAGNOSIS — H52223 Regular astigmatism, bilateral: Secondary | ICD-10-CM | POA: Diagnosis not present

## 2017-02-08 ENCOUNTER — Other Ambulatory Visit: Payer: Self-pay | Admitting: Internal Medicine

## 2017-02-08 DIAGNOSIS — Z1231 Encounter for screening mammogram for malignant neoplasm of breast: Secondary | ICD-10-CM

## 2017-03-26 ENCOUNTER — Ambulatory Visit
Admission: RE | Admit: 2017-03-26 | Discharge: 2017-03-26 | Disposition: A | Discharge status: Home / Self Care | Payer: 59 | Source: Ambulatory Visit | Attending: Internal Medicine | Admitting: Internal Medicine

## 2017-03-26 DIAGNOSIS — Z1231 Encounter for screening mammogram for malignant neoplasm of breast: Secondary | ICD-10-CM | POA: Diagnosis not present

## 2017-04-02 ENCOUNTER — Encounter: Payer: Self-pay | Admitting: Advanced Practice Midwife

## 2017-05-07 ENCOUNTER — Ambulatory Visit (INDEPENDENT_AMBULATORY_CARE_PROVIDER_SITE_OTHER): Payer: 59 | Admitting: Advanced Practice Midwife

## 2017-05-07 ENCOUNTER — Encounter: Payer: Self-pay | Admitting: Advanced Practice Midwife

## 2017-05-07 VITALS — BP 116/70 | Ht 67.0 in | Wt 142.0 lb

## 2017-05-07 DIAGNOSIS — Z124 Encounter for screening for malignant neoplasm of cervix: Secondary | ICD-10-CM | POA: Diagnosis not present

## 2017-05-07 DIAGNOSIS — Z01419 Encounter for gynecological examination (general) (routine) without abnormal findings: Secondary | ICD-10-CM | POA: Diagnosis not present

## 2017-05-07 NOTE — Progress Notes (Signed)
Patient ID: Shelley Davila, female   DOB: 1970/11/24, 46 y.o.   MRN: [id number]     Gynecology Annual Exam  PCP: Rusty Aus, MD  Chief Complaint:  Chief Complaint  Patient presents with  . Annual Exam    History of Present Illness: Patient is a 46 y.o. G2P2002 presents for annual exam. The patient has complaints today of hormone fluctuation changes related to her cycle. She states around mid cycle she will feel generally bloated/puffy which resolves by then end of the period. This has been going on for about 3 years.  Discussion of normal pre-menopausal changes. Patient is not requesting hormone therapy.  LMP: Patient's last menstrual period was 04/22/2017. Average Interval: regular, 28 days Duration of flow: 8 days Heavy Menses: variable, heavy for the first 3 days then lightens Clots: no Intermenstrual Bleeding: no Postcoital Bleeding: no Dysmenorrhea: no   The patient is sexually active. She currently uses vasectomy for contraception. She denies dyspareunia.  The patient does perform self breast exams.  There is no notable family history of breast or ovarian cancer in her family. She has a history of fibroadenoma breast tissue with lumpectomy and placement of clips.  The patient wears seatbelts: yes.   The patient has regular exercise: yes.  She admits healthy lifestyle diet, hydration and exercise.  The patient denies current symptoms of depression.    Review of Systems: Review of Systems  Constitutional: Negative.   HENT: Negative.   Eyes: Negative.   Respiratory: Negative.   Cardiovascular: Negative.   Gastrointestinal: Negative.   Genitourinary: Negative.   Musculoskeletal: Negative.   Skin: Negative.   Neurological: Negative.   Endo/Heme/Allergies: Negative.   Psychiatric/Behavioral: Negative.     Past Medical History:  Past Medical History:  Diagnosis Date  . Complication of anesthesia    hard to wake up  . Fibroadenoma of both breasts    has had  multiple lumpectomies and biopsies of both breasts    Past Surgical History:  Past Surgical History:  Procedure Laterality Date  . BREAST BIOPSY Bilateral    X2  . BREAST SURGERY     multiple lumpectomies and biopsies on both breasts  . EXCISION OF BREAST BIOPSY Right 03/05/2016   Procedure: EXCISION OF TWO FIBROADENOMAS RIGHT  BREAST ;  Surgeon: Donnie Mesa, MD;  Location: [facility];  Service: General;  Laterality: Right;    Gynecologic History:  Patient's last menstrual period was 04/22/2017. Contraception: vasectomy Last Pap: Results were: 2 or 3 years ago  no abnormalities  Last mammogram: 1 month ago Results were: BI-RAD I Obstetric History: [id number]  Family History:  Family History  Problem Relation Age of Onset  . COPD Mother   . Hypercholesterolemia Mother   . Arthritis Father   . Breast cancer Neg Hx     Social History:  Social History   Social History  . Marital status: Married    Spouse name: N/A  . Number of children: N/A  . Years of education: N/A   Occupational History  . Not on file.   Social History Main Topics  . Smoking status: Never Smoker  . Smokeless tobacco: Not on file  . Alcohol use Yes     Comment: social  . Drug use: No  . Sexual activity: Yes    Birth control/ protection: Surgical     Comment: husband has had vasectomy   Other Topics Concern  . Not on file   Social History Narrative  .  No narrative on file    Allergies:  Allergies  Allergen Reactions  . Bee Venom Anaphylaxis    Medications: Prior to Admission medications   Medication Sig Start Date End Date Taking? Authorizing Provider  cholecalciferol (VITAMIN D) 1000 units tablet Take 2,000 Units by mouth daily.   Yes [provider]  HYDROcodone-acetaminophen (NORCO/VICODIN) 5-325 MG tablet Take 1 tablet by mouth every 4 (four) hours as needed. Patient not taking: Reported on 05/07/2017 03/05/16   Donnie Mesa, MD    Physical Exam Vitals:  Blood pressure 116/70, height 5\' 7"  (1.702 m), weight 142 lb (64.4 kg), last menstrual period 04/22/2017.  General: NAD HEENT: normocephalic, anicteric Thyroid: no enlargement, no palpable nodules Pulmonary: No increased work of breathing, CTAB Cardiovascular: RRR, distal pulses 2+ Breast: Breast symmetrical, no tenderness, no palpable nodules or masses, no skin or nipple retraction present, no nipple discharge.  No axillary or supraclavicular lymphadenopathy. Abdomen: NABS, soft, non-tender, non-distended.  Umbilicus without lesions.  No hepatomegaly, splenomegaly or masses palpable. No evidence of hernia  Genitourinary:  External: Normal external female genitalia.  Normal urethral meatus, normal  Bartholin's and Skene's glands.    Vagina: Normal vaginal mucosa, no evidence of prolapse.    Cervix: Grossly normal in appearance, no bleeding, no CMT  Uterus: Non-enlarged, mobile, normal contour.    Adnexa: ovaries non-enlarged, no adnexal masses  Rectal: deferred  Lymphatic: no evidence of inguinal lymphadenopathy Extremities: no edema, erythema, or tenderness Neurologic: Grossly intact Psychiatric: mood appropriate, affect full   Assessment: 46 y.o. Y3K1601 Well woman exam with PAP smear  Plan: Problem List Items Addressed This Visit    None    Visit Diagnoses    Well woman exam with routine gynecological exam    -  Primary   Relevant Orders   IGP, Aptima HPV   Cervical cancer screening       Relevant Orders   IGP, Aptima HPV      1) Mammogram - recommend yearly screening mammogram.  Mammogram Is up to date   2) Continue healthy lifestyle diet and exercise.   3) ASCCP guidelines and rational discussed.  Patient opts for every 2-3 years screening interval  4) Contraception - vasectomy  5) Colonoscopy: patient to consider guidelines and probably wait until future visit for referral-- Screening recommended starting at age 81 for average risk individuals, age 48 for  individuals deemed at increased risk (including African Americans) and recommended to continue until age 27.  For patient age 61-85 individualized approach is recommended.  Gold standard screening is via colonoscopy, Cologuard screening is an acceptable alternative for patient unwilling or unable to undergo colonoscopy.  "Colorectal cancer screening for average?risk adults: 2018 guideline update from the [place]: A Cancer Journal for Clinicians: Mar 10, 2017   6) Routine healthcare maintenance including cholesterol, diabetes screening discussed managed by PCP  7) Return to clinic in 1 year for annual exam  Rod Can, CNM

## 2017-05-11 LAB — IGP, APTIMA HPV
HPV Aptima: NEGATIVE
PAP SMEAR COMMENT: 0

## 2018-02-25 ENCOUNTER — Other Ambulatory Visit: Payer: Self-pay | Admitting: Internal Medicine

## 2018-02-25 DIAGNOSIS — Z1231 Encounter for screening mammogram for malignant neoplasm of breast: Secondary | ICD-10-CM

## 2018-04-01 ENCOUNTER — Ambulatory Visit
Admission: RE | Admit: 2018-04-01 | Discharge: 2018-04-01 | Disposition: A | Discharge status: Home / Self Care | Payer: No Typology Code available for payment source | Source: Ambulatory Visit | Attending: Internal Medicine | Admitting: Internal Medicine

## 2018-04-01 DIAGNOSIS — Z1231 Encounter for screening mammogram for malignant neoplasm of breast: Secondary | ICD-10-CM

## 2019-02-28 ENCOUNTER — Other Ambulatory Visit: Payer: Self-pay | Admitting: Internal Medicine

## 2019-02-28 DIAGNOSIS — Z1231 Encounter for screening mammogram for malignant neoplasm of breast: Secondary | ICD-10-CM

## 2019-04-21 ENCOUNTER — Ambulatory Visit
Admission: RE | Admit: 2019-04-21 | Discharge: 2019-04-21 | Disposition: A | Discharge status: Home / Self Care | Payer: No Typology Code available for payment source | Source: Ambulatory Visit | Attending: Internal Medicine | Admitting: Internal Medicine

## 2019-04-21 ENCOUNTER — Other Ambulatory Visit: Payer: Self-pay

## 2019-04-21 DIAGNOSIS — Z1231 Encounter for screening mammogram for malignant neoplasm of breast: Secondary | ICD-10-CM

## 2020-04-24 ENCOUNTER — Other Ambulatory Visit: Payer: Self-pay | Admitting: Internal Medicine

## 2020-04-24 DIAGNOSIS — N6310 Unspecified lump in the right breast, unspecified quadrant: Secondary | ICD-10-CM

## 2020-05-03 ENCOUNTER — Other Ambulatory Visit (HOSPITAL_COMMUNITY): Payer: Self-pay | Admitting: Internal Medicine

## 2020-05-03 MED FILL — OMEPRAZOLE 40 MG CPDR: 40 | 90 days supply | Qty: 90 | Fill #0

## 2020-05-06 ENCOUNTER — Ambulatory Visit
Admission: RE | Admit: 2020-05-06 | Discharge: 2020-05-06 | Disposition: A | Discharge status: Home / Self Care | Payer: No Typology Code available for payment source | Source: Ambulatory Visit | Attending: Internal Medicine | Admitting: Internal Medicine

## 2020-05-06 ENCOUNTER — Other Ambulatory Visit: Payer: Self-pay | Admitting: Internal Medicine

## 2020-05-06 ENCOUNTER — Other Ambulatory Visit: Payer: Self-pay

## 2020-05-06 DIAGNOSIS — N6310 Unspecified lump in the right breast, unspecified quadrant: Secondary | ICD-10-CM

## 2020-08-08 MED FILL — OMEPRAZOLE 40 MG CPDR: 40 | 90 days supply | Qty: 90 | Fill #1

## 2020-10-01 ENCOUNTER — Ambulatory Visit (INDEPENDENT_AMBULATORY_CARE_PROVIDER_SITE_OTHER): Payer: No Typology Code available for payment source | Admitting: Gastroenterology

## 2020-10-01 ENCOUNTER — Encounter: Payer: Self-pay | Admitting: Gastroenterology

## 2020-10-01 ENCOUNTER — Other Ambulatory Visit: Payer: Self-pay

## 2020-10-01 VITALS — BP 141/84 | HR 67 | Ht 67.0 in | Wt 142.6 lb

## 2020-10-01 DIAGNOSIS — K219 Gastro-esophageal reflux disease without esophagitis: Secondary | ICD-10-CM | POA: Diagnosis not present

## 2020-10-01 DIAGNOSIS — R1310 Dysphagia, unspecified: Secondary | ICD-10-CM

## 2020-10-01 DIAGNOSIS — Z1211 Encounter for screening for malignant neoplasm of colon: Secondary | ICD-10-CM | POA: Diagnosis not present

## 2020-10-01 NOTE — Progress Notes (Signed)
Wyline Mood MD, MRCP(U.K) [address]  [address]  [place], [place] [address]  Main: [phone]  Fax: [phone]   Gastroenterology Consultation  Referring Provider:     Danella Penton, MD Primary Care Physician:  Danella Penton, MD Primary Gastroenterologist:  Dr. Wyline Mood  Reason for Consultation:     Dysphagia        HPI:   Shelley Davila is a 49 y.o. y/o female referred for consultation & management  by Dr. Hyacinth Meeker, Hardin Negus, MD.   She recollects that she may have had heartburn for many years but over the last 6 months has got significantly worse.  Depending on the types of food she eats may have significant symptoms right after her lunch or breakfast.  Symptoms have significantly improved after taking her omeprazole which she was started on recently.  When she goes off of it has recurrence of her symptoms.  Some spasm-like symptoms of the esophagus.  She is allergic to bees.  Mother had Barrett's esophagus but she was also a smoker.  No prior endoscopic evaluation.  She has never had a colonoscopy.  She is due for 1.  No family history of colon polyps or cancer that she is aware of.    Past Medical History:  Diagnosis Date  . Complication of anesthesia    hard to wake up  . Fibroadenoma of both breasts    has had multiple lumpectomies and biopsies of both breasts    Past Surgical History:  Procedure Laterality Date  . BREAST BIOPSY Bilateral    X2  . BREAST SURGERY     multiple lumpectomies and biopsies on both breasts  . EXCISION OF BREAST BIOPSY Right 03/05/2016   Procedure: EXCISION OF TWO FIBROADENOMAS RIGHT  BREAST ;  Surgeon: Manus Rudd, MD;  Location: [facility];  Service: General;  Laterality: Right;    Prior to Admission medications   Medication Sig Start Date End Date Taking? Authorizing Provider  cholecalciferol (VITAMIN D) 1000 units tablet Take 2,000 Units by mouth daily.    [provider]  HYDROcodone-acetaminophen  (NORCO/VICODIN) 5-325 MG tablet Take 1 tablet by mouth every 4 (four) hours as needed. Patient not taking: Reported on 05/07/2017 03/05/16   Manus Rudd, MD    Family History  Problem Relation Age of Onset  . COPD Mother   . Hypercholesterolemia Mother   . Arthritis Father   . Breast cancer Neg Hx      Social History   Tobacco Use  . Smoking status: Never Smoker  . Smokeless tobacco: Never Used  Vaping Use  . Vaping Use: Never used  Substance Use Topics  . Alcohol use: Yes    Comment: social  . Drug use: No    Allergies as of 10/01/2020 - Review Complete 10/01/2020  Allergen Reaction Noted  . Bee venom Anaphylaxis 02/28/2016    Review of Systems:    All systems reviewed and negative except where noted in HPI.   Physical Exam:  BP (!) 141/84 (BP Location: Left Arm, Patient Position: Sitting, Cuff Size: Normal)   Pulse 67   Ht 5\' 7"  (1.702 m)   Wt 142 lb 9.6 oz (64.7 kg)   BMI 22.33 kg/m  No LMP recorded. Psych:  Alert and cooperative. Normal mood and affect. General:   Alert,  Well-developed, well-nourished, pleasant and cooperative in NAD Head:  Normocephalic and atraumatic. Eyes:  Sclera clear, no icterus.   Conjunctiva pink. Ears:  Normal auditory acuity. Lungs:  Respirations even and unlabored.  Clear throughout to auscultation.   No wheezes, crackles, or rhonchi. No acute distress. Heart:  Regular rate and rhythm , systolic murmur in the aortic area nonradiating clicks, rubs, or gallops. Abdomen:  Normal bowel sounds.  No bruits.  Soft, non-tender and non-distended without masses, hepatosplenomegaly or hernias noted.  No guarding or rebound tenderness.    Neurologic:  Alert and oriented x3;  grossly normal neurologically. Psych:  Alert and cooperative. Normal mood and affect.  Imaging Studies: No results found.  Assessment and Plan:   Shelley Davila is a 49 y.o. y/o female has been referred for dysphagia.  History suggestive of acid reflux.  Mother  had a history of Barrett's esophagus.  It is possible that the patient has had acid reflux for many years.  No significant risk factors.  She is not obese.  Also due for screening colonoscopy average risk no prior endoscopic evaluation of the colon.  Plan 1.  EGD to evaluate for stricture, eosinophilic esophagitis, Barrett's esophagus.  Screening colonoscopy 2.  Sample of the bowel prep will be provided 3.  Patient information on acid reflux provided.  Counseled on lifestyle changes.  If she has a hiatal hernia we will discuss about surgical correction at the next visit. 4.  Continue taking omeprazole 40 mg once a day 30 minutes before breakfast  I have discussed alternative options, risks & benefits,  which include, but are not limited to, bleeding, infection, perforation,respiratory complication & drug reaction.  The patient agrees with this plan & written consent will be obtained.     Follow up in 3 months telephone visit  Dr Jonathon Bellows MD,MRCP(U.K)

## 2020-10-01 NOTE — Progress Notes (Signed)
Prep sample given in office. Instructions given and procedure scheduled.

## 2020-10-03 ENCOUNTER — Other Ambulatory Visit: Payer: Self-pay | Admitting: Internal Medicine

## 2020-10-21 MED FILL — OMEPRAZOLE 40 MG CPDR: 40 | 90 days supply | Qty: 90 | Fill #2

## 2020-11-11 ENCOUNTER — Other Ambulatory Visit: Payer: No Typology Code available for payment source

## 2020-11-15 ENCOUNTER — Other Ambulatory Visit: Payer: Self-pay | Admitting: Internal Medicine

## 2020-11-15 ENCOUNTER — Other Ambulatory Visit: Payer: Self-pay

## 2020-11-15 ENCOUNTER — Ambulatory Visit
Admission: RE | Admit: 2020-11-15 | Discharge: 2020-11-15 | Disposition: A | Discharge status: Home / Self Care | Payer: No Typology Code available for payment source | Source: Ambulatory Visit | Attending: Internal Medicine | Admitting: Internal Medicine

## 2020-11-15 DIAGNOSIS — N6310 Unspecified lump in the right breast, unspecified quadrant: Secondary | ICD-10-CM

## 2020-11-18 ENCOUNTER — Telehealth: Payer: Self-pay | Admitting: Gastroenterology

## 2020-11-18 NOTE — Telephone Encounter (Signed)
Patient called back, she returns to clinic at 1:30 pm

## 2020-11-18 NOTE — Telephone Encounter (Signed)
Patient has questions about insurance coding on her upcoming procedure, please call to advise.

## 2020-11-18 NOTE — Telephone Encounter (Signed)
Returned patients call. LVM to call office back. 

## 2020-11-19 NOTE — Telephone Encounter (Signed)
Spoke with patient in regards to cancelling her EGD procedure and only proceed with colonoscopy. Endo unit will be contacted about the change.

## 2020-11-20 ENCOUNTER — Other Ambulatory Visit
Admission: RE | Admit: 2020-11-20 | Discharge: 2020-11-20 | Disposition: A | Discharge status: Home / Self Care | Payer: No Typology Code available for payment source | Source: Ambulatory Visit | Attending: Gastroenterology | Admitting: Gastroenterology

## 2020-11-20 ENCOUNTER — Other Ambulatory Visit: Payer: Self-pay

## 2020-11-20 DIAGNOSIS — Z20822 Contact with and (suspected) exposure to covid-19: Secondary | ICD-10-CM | POA: Diagnosis not present

## 2020-11-20 DIAGNOSIS — Z01812 Encounter for preprocedural laboratory examination: Secondary | ICD-10-CM | POA: Diagnosis not present

## 2020-11-20 LAB — SARS CORONAVIRUS 2 (TAT 6-24 HRS): SARS Coronavirus 2: NEGATIVE

## 2020-11-21 ENCOUNTER — Encounter: Payer: Self-pay | Admitting: Gastroenterology

## 2020-11-22 ENCOUNTER — Ambulatory Visit: Payer: No Typology Code available for payment source | Admitting: Anesthesiology

## 2020-11-22 ENCOUNTER — Encounter: Payer: Self-pay | Admitting: Gastroenterology

## 2020-11-22 ENCOUNTER — Ambulatory Visit
Admission: RE | Admit: 2020-11-22 | Discharge: 2020-11-22 | Disposition: A | Discharge status: Home / Self Care | Payer: No Typology Code available for payment source | Attending: Gastroenterology | Admitting: Gastroenterology

## 2020-11-22 ENCOUNTER — Encounter
Admission: RE | Disposition: A | Discharge status: Home / Self Care | Payer: Self-pay | Source: Home / Self Care | Attending: Gastroenterology

## 2020-11-22 DIAGNOSIS — Z1211 Encounter for screening for malignant neoplasm of colon: Secondary | ICD-10-CM | POA: Diagnosis not present

## 2020-11-22 DIAGNOSIS — Z79899 Other long term (current) drug therapy: Secondary | ICD-10-CM | POA: Insufficient documentation

## 2020-11-22 DIAGNOSIS — R1310 Dysphagia, unspecified: Secondary | ICD-10-CM

## 2020-11-22 HISTORY — PX: COLONOSCOPY WITH PROPOFOL: SHX5780

## 2020-11-22 LAB — POCT PREGNANCY, URINE: Preg Test, Ur: NEGATIVE

## 2020-11-22 SURGERY — COLONOSCOPY WITH PROPOFOL
Anesthesia: General

## 2020-11-22 MED ORDER — PROPOFOL 10 MG/ML IV BOLUS
INTRAVENOUS | Status: AC
  Filled 2020-11-22: qty 20

## 2020-11-22 MED ORDER — SODIUM CHLORIDE 0.9 % IV SOLN
INTRAVENOUS | Status: DC
  Administered 2020-11-22: 20 mL/h via INTRAVENOUS

## 2020-11-22 MED ORDER — DEXAMETHASONE SODIUM PHOSPHATE 10 MG/ML IJ SOLN
INTRAMUSCULAR | Status: DC | PRN
  Administered 2020-11-22: 8 mg via INTRAVENOUS

## 2020-11-22 MED ORDER — PROPOFOL 500 MG/50ML IV EMUL
INTRAVENOUS | Status: DC | PRN
  Administered 2020-11-22: 180 ug/kg/min via INTRAVENOUS

## 2020-11-22 MED ORDER — PROPOFOL 10 MG/ML IV BOLUS
INTRAVENOUS | Status: DC | PRN
  Administered 2020-11-22: 20 mg via INTRAVENOUS
  Administered 2020-11-22: 45 mg via INTRAVENOUS
  Administered 2020-11-22: 20 mg via INTRAVENOUS

## 2020-11-22 MED ORDER — ONDANSETRON HCL 4 MG/2ML IJ SOLN
INTRAMUSCULAR | Status: DC | PRN
  Administered 2020-11-22: 4 mg via INTRAVENOUS

## 2020-11-22 MED ORDER — PROPOFOL 500 MG/50ML IV EMUL
INTRAVENOUS | Status: AC
  Filled 2020-11-22: qty 50

## 2020-11-22 MED ORDER — LIDOCAINE HCL (PF) 2 % IJ SOLN
INTRAMUSCULAR | Status: AC
  Filled 2020-11-22: qty 5

## 2020-11-22 NOTE — H&P (Signed)
Jonathon Bellows, MD [address], [place], [place], [place], [address] [address], [place], [place], [place], [address] Phone: [phone]  Fax: [phone]  Primary Care Physician:  Rusty Aus, MD   Pre-Procedure History & Physical: HPI:  Shelley Davila is a 50 y.o. female is here for an colonoscopy.   Past Medical History:  Diagnosis Date  . Complication of anesthesia    hard to wake up  . Fibroadenoma of both breasts    has had multiple lumpectomies and biopsies of both breasts    Past Surgical History:  Procedure Laterality Date  . BREAST BIOPSY Bilateral    X2  . BREAST SURGERY     multiple lumpectomies and biopsies on both breasts  . EXCISION OF BREAST BIOPSY Right 03/05/2016   Procedure: EXCISION OF TWO FIBROADENOMAS RIGHT  BREAST ;  Surgeon: Donnie Mesa, MD;  Location: [facility];  Service: General;  Laterality: Right;    Prior to Admission medications   Medication Sig Start Date End Date Taking? Authorizing Provider  cholecalciferol (VITAMIN D) 1000 units tablet Take 2,000 Units by mouth daily.   Yes [provider]  HYDROcodone-acetaminophen (NORCO/VICODIN) 5-325 MG tablet Take 1 tablet by mouth every 4 (four) hours as needed. 03/05/16  Yes Donnie Mesa, MD  omeprazole (PRILOSEC) 40 MG capsule Take 40 mg by mouth daily.   Yes [provider]    Allergies as of 10/01/2020 - Review Complete 10/01/2020  Allergen Reaction Noted  . Bee venom Anaphylaxis 02/28/2016    Family History  Problem Relation Age of Onset  . COPD Mother   . Hypercholesterolemia Mother   . Arthritis Father   . Breast cancer Neg Hx     Social History   Socioeconomic History  . Marital status: Married    Spouse name: Not on file  . Number of children: Not on file  . Years of education: Not on file  . Highest education level: Not on file  Occupational History  . Not on file  Tobacco Use  . Smoking status: Never Smoker  .  Smokeless tobacco: Never Used  Vaping Use  . Vaping Use: Never used  Substance and Sexual Activity  . Alcohol use: Yes    Comment: social  . Drug use: No  . Sexual activity: Yes    Birth control/protection: Surgical    Comment: husband has had vasectomy  Other Topics Concern  . Not on file  Social History Narrative  . Not on file   Social Determinants of Health   Financial Resource Strain: Not on file  Food Insecurity: Not on file  Transportation Needs: Not on file  Physical Activity: Not on file  Stress: Not on file  Social Connections: Not on file  Intimate Partner Violence: Not on file    Review of Systems: See HPI, otherwise negative ROS  Physical Exam: BP 133/86   Pulse 100   Temp 97.6 F (36.4 C) (Temporal)   Resp 20   Ht 5\' 7"  (1.702 m)   Wt 60.8 kg   SpO2 100%   BMI 20.99 kg/m  General:   Alert,  pleasant and cooperative in NAD Head:  Normocephalic and atraumatic. Neck:  Supple; no masses or thyromegaly. Lungs:  Clear throughout to auscultation, normal respiratory effort.    Heart:  +S1, +S2, Regular rate and rhythm, No edema. Abdomen:  Soft, nontender and nondistended. Normal bowel sounds, without guarding, and without rebound.   Neurologic:  Alert and  oriented x4;  grossly normal neurologically.  Impression/Plan: Shelley Davila is here for an colonoscopy to be performed for Screening colonoscopy average risk   Risks, benefits, limitations, and alternatives regarding  colonoscopy have been reviewed with the patient.  Questions have been answered.  All parties agreeable.   Jonathon Bellows, MD  11/22/2020, 10:23 AM\

## 2020-11-22 NOTE — Anesthesia Procedure Notes (Signed)
Date/Time: 11/22/2020 10:30 AM Performed by: Allean Found, CRNA Pre-anesthesia Checklist: Patient identified, Emergency Drugs available, Suction available, Patient being monitored and Timeout performed Patient Re-evaluated:Patient Re-evaluated prior to induction Oxygen Delivery Method: Nasal cannula Placement Confirmation: positive ETCO2

## 2020-11-22 NOTE — Anesthesia Preprocedure Evaluation (Signed)
Anesthesia Evaluation  Patient identified by MRN, date of birth, ID band Patient awake    Reviewed: Allergy & Precautions, NPO status , Patient's Chart, lab work & pertinent test results  History of Anesthesia Complications (+) PONV and history of anesthetic complications  Airway Mallampati: II  TM Distance: >3 FB Neck ROM: Full    Dental no notable dental hx. (+) Teeth Intact   Pulmonary neg pulmonary ROS, neg sleep apnea, neg COPD, Patient abstained from smoking.Not current smoker,    Pulmonary exam normal breath sounds clear to auscultation       Cardiovascular Exercise Tolerance: Good METS(-) hypertension(-) CAD and (-) Past MI negative cardio ROS  (-) dysrhythmias  Rhythm:Regular Rate:Normal - Systolic murmurs    Neuro/Psych negative neurological ROS  negative psych ROS   GI/Hepatic neg GERD  ,(+)     (-) substance abuse  ,   Endo/Other  neg diabetes  Renal/GU negative Renal ROS     Musculoskeletal   Abdominal   Peds  Hematology   Anesthesia Other Findings Past Medical History: No date: Complication of anesthesia     Comment:  hard to wake up No date: Fibroadenoma of both breasts     Comment:  has had multiple lumpectomies and biopsies of both               breasts  Reproductive/Obstetrics                             Anesthesia Physical Anesthesia Plan  ASA: II  Anesthesia Plan: General   Post-op Pain Management:    Induction: Intravenous  PONV Risk Score and Plan: 4 or greater and Ondansetron, Propofol infusion and TIVA  Airway Management Planned: Nasal Cannula  Additional Equipment: None  Intra-op Plan:   Post-operative Plan:   Informed Consent: I have reviewed the patients History and Physical, chart, labs and discussed the procedure including the risks, benefits and alternatives for the proposed anesthesia with the patient or authorized representative who has  indicated his/her understanding and acceptance.     Dental advisory given  Plan Discussed with: CRNA and Surgeon  Anesthesia Plan Comments: (Discussed risks of anesthesia with patient, including possibility of difficulty with spontaneous ventilation under anesthesia necessitating airway intervention, PONV, and rare risks such as cardiac or respiratory or neurological events. Patient understands.)        Anesthesia Quick Evaluation

## 2020-11-22 NOTE — Op Note (Signed)
[facility] Gastroenterology Patient Name: Shelley Davila Procedure Date: 11/22/2020 10:26 AM MRN: [id number] Account #: [id number] Date of Birth: Oct 28, 1970 Admit Type: Outpatient Age: 50 Room: [facility] ENDO ROOM 4 Gender: Female Note Status: Finalized Procedure:             Colonoscopy Indications:           Screening for colorectal malignant neoplasm Providers:             Jonathon Bellows MD, MD Referring MD:          Rusty Aus, MD (Referring MD) Medicines:             Monitored Anesthesia Care Complications:         No immediate complications. Procedure:             Pre-Anesthesia Assessment:                        - Prior to the procedure, a History and Physical was                         performed, and patient medications, allergies and                         sensitivities were reviewed. The patient's tolerance                         of previous anesthesia was reviewed.                        - The risks and benefits of the procedure and the                         sedation options and risks were discussed with the                         patient. All questions were answered and informed                         consent was obtained.                        - ASA Grade Assessment: II - A patient with mild                         systemic disease.                        After obtaining informed consent, the colonoscope was                         passed under direct vision. Throughout the procedure,                         the patient's blood pressure, pulse, and oxygen                         saturations were monitored continuously. The                         Colonoscope was introduced through the anus  and                         advanced to the the cecum, identified by the                         appendiceal orifice. The colonoscopy was performed                         with ease. The patient tolerated the procedure well.                         The quality of  the bowel preparation was excellent. Findings:      The perianal and digital rectal examinations were normal.      The entire examined colon appeared normal on direct and retroflexion       views. Impression:            - The entire examined colon is normal on direct and                         retroflexion views.                        - No specimens collected. Recommendation:        - Discharge patient to home (with escort).                        - Resume previous diet.                        - Continue present medications.                        - Repeat colonoscopy in 10 years for screening                         purposes. Procedure Code(s):     --- Professional ---                        [phone], Colonoscopy, flexible; diagnostic, including                         collection of specimen(s) by brushing or washing, when                         performed (separate procedure) Diagnosis Code(s):     --- Professional ---                        Z12.11, Encounter for screening for malignant neoplasm                         of colon CPT copyright 2019 American Medical Association. All rights reserved. The codes documented in this report are preliminary and upon coder review may  be revised to meet current compliance requirements. Jonathon Bellows, MD Jonathon Bellows MD, MD 11/22/2020 11:02:18 AM This report has been signed electronically. Number of Addenda: 0 Note Initiated On: 11/22/2020 10:26 AM Scope Withdrawal Time: 0 hours 15 minutes 42 seconds  Total Procedure Duration: 0 hours 19 minutes 9 seconds  Estimated Blood Loss:  Estimated blood loss: none.      [facility]

## 2020-11-22 NOTE — Transfer of Care (Signed)
Immediate Anesthesia Transfer of Care Note  Patient: Physicist, medical  Procedure(s) Performed: COLONOSCOPY WITH PROPOFOL (N/A )  Patient Location: PACU  Anesthesia Type:General  Level of Consciousness: awake, alert  and oriented  Airway & Oxygen Therapy: Patient Spontanous Breathing and Patient connected to nasal cannula oxygen  Post-op Assessment: Report given to RN and Post -op Vital signs reviewed and stable  Post vital signs: Reviewed and stable  Last Vitals:  Vitals Value Taken Time  BP 95/61 11/22/20 1103  Temp    Pulse 61 11/22/20 1104  Resp 16 11/22/20 1104  SpO2 100 % 11/22/20 1104  Vitals shown include unvalidated device data.  Last Pain:  Vitals:   11/22/20 0902  TempSrc: Temporal  PainSc: 0-No pain         Complications: No complications documented.

## 2020-11-22 NOTE — Anesthesia Postprocedure Evaluation (Signed)
Anesthesia Post Note  Patient: Physicist, medical  Procedure(s) Performed: COLONOSCOPY WITH PROPOFOL (N/A )  Patient location during evaluation: Endoscopy Anesthesia Type: General Level of consciousness: awake and alert Pain management: pain level controlled Vital Signs Assessment: post-procedure vital signs reviewed and stable Respiratory status: spontaneous breathing, nonlabored ventilation, respiratory function stable and patient connected to nasal cannula oxygen Cardiovascular status: blood pressure returned to baseline and stable Postop Assessment: no apparent nausea or vomiting Anesthetic complications: no   No complications documented.   Last Vitals:  Vitals:   11/22/20 1120 11/22/20 1127  BP:    Pulse:  60  Resp: 14 14  Temp:    SpO2:  (!) 85%    Last Pain:  Vitals:   11/22/20 0902  TempSrc: Temporal  PainSc: 0-No pain                 Arita Miss

## 2020-12-20 MED FILL — ETODOLAC 400 MG TABS: 400 | 30 days supply | Qty: 60 | Fill #0

## 2020-12-31 ENCOUNTER — Other Ambulatory Visit (HOSPITAL_BASED_OUTPATIENT_CLINIC_OR_DEPARTMENT_OTHER): Payer: Self-pay

## 2021-01-07 ENCOUNTER — Other Ambulatory Visit (HOSPITAL_BASED_OUTPATIENT_CLINIC_OR_DEPARTMENT_OTHER): Payer: Self-pay

## 2021-01-17 ENCOUNTER — Other Ambulatory Visit: Payer: Self-pay

## 2021-01-17 ENCOUNTER — Other Ambulatory Visit (HOSPITAL_COMMUNITY): Payer: Self-pay

## 2021-01-17 MED ORDER — ETODOLAC 400 MG PO TABS
ORAL_TABLET | ORAL | 11 refills | Status: DC
  Filled 2021-01-17: qty 60, 30d supply, fill #0

## 2021-01-17 MED ORDER — PREDNISONE 20 MG PO TABS
ORAL_TABLET | ORAL | 0 refills | Status: DC
  Filled 2021-01-17: qty 17, 12d supply, fill #0

## 2021-01-17 MED ORDER — ETODOLAC 400 MG PO TABS
ORAL_TABLET | ORAL | 11 refills | Status: DC
  Filled 2021-01-17 – 2021-07-27 (×2): qty 60, 30d supply, fill #0

## 2021-01-20 ENCOUNTER — Other Ambulatory Visit (HOSPITAL_COMMUNITY): Payer: Self-pay

## 2021-01-28 ENCOUNTER — Other Ambulatory Visit (HOSPITAL_COMMUNITY): Payer: Self-pay

## 2021-01-28 MED FILL — Omeprazole Cap Delayed Release 40 MG: ORAL | 90 days supply | Qty: 90 | Fill #0 | Status: AC

## 2021-01-29 ENCOUNTER — Other Ambulatory Visit (HOSPITAL_COMMUNITY): Payer: Self-pay

## 2021-02-08 ENCOUNTER — Telehealth: Payer: No Typology Code available for payment source | Admitting: Physician Assistant

## 2021-02-08 DIAGNOSIS — R300 Dysuria: Secondary | ICD-10-CM

## 2021-02-08 MED ORDER — CEPHALEXIN 500 MG PO CAPS: 500.0000 mg | ORAL_CAPSULE | Freq: Two times a day (BID) | ORAL | 0 refills | Status: AC

## 2021-02-08 NOTE — Progress Notes (Signed)

## 2021-02-08 NOTE — Progress Notes (Signed)
I have spent 5 minutes in review of e-visit questionnaire, review and updating patient chart, medical decision making and response to patient.   Pamlea Finder Cody Maxton Noreen, PA-C    

## 2021-04-11 ENCOUNTER — Other Ambulatory Visit (HOSPITAL_COMMUNITY): Payer: Self-pay

## 2021-04-15 ENCOUNTER — Other Ambulatory Visit (HOSPITAL_COMMUNITY): Payer: Self-pay

## 2021-04-17 ENCOUNTER — Other Ambulatory Visit (HOSPITAL_COMMUNITY): Payer: Self-pay

## 2021-04-18 ENCOUNTER — Other Ambulatory Visit (HOSPITAL_COMMUNITY): Payer: Self-pay

## 2021-04-18 MED ORDER — OMEPRAZOLE 40 MG PO CPDR
DELAYED_RELEASE_CAPSULE | ORAL | 3 refills | Status: DC
  Filled 2021-04-18: qty 90, 90d supply, fill #0
  Filled 2021-07-27: qty 90, 90d supply, fill #1
  Filled 2021-11-04: qty 90, 90d supply, fill #2
  Filled 2022-02-16: qty 90, 90d supply, fill #3

## 2021-05-16 ENCOUNTER — Other Ambulatory Visit: Payer: No Typology Code available for payment source

## 2021-06-13 ENCOUNTER — Ambulatory Visit
Admission: RE | Admit: 2021-06-13 | Discharge: 2021-06-13 | Disposition: A | Discharge status: Home / Self Care | Payer: No Typology Code available for payment source | Source: Ambulatory Visit | Attending: Internal Medicine | Admitting: Internal Medicine

## 2021-06-13 ENCOUNTER — Other Ambulatory Visit: Payer: Self-pay

## 2021-06-13 DIAGNOSIS — N6310 Unspecified lump in the right breast, unspecified quadrant: Secondary | ICD-10-CM

## 2021-06-27 ENCOUNTER — Other Ambulatory Visit (HOSPITAL_COMMUNITY): Payer: Self-pay | Admitting: Sports Medicine

## 2021-06-27 ENCOUNTER — Other Ambulatory Visit: Payer: Self-pay | Admitting: Sports Medicine

## 2021-06-27 DIAGNOSIS — M47816 Spondylosis without myelopathy or radiculopathy, lumbar region: Secondary | ICD-10-CM

## 2021-06-27 DIAGNOSIS — G8929 Other chronic pain: Secondary | ICD-10-CM

## 2021-06-27 DIAGNOSIS — M5136 Other intervertebral disc degeneration, lumbar region: Secondary | ICD-10-CM

## 2021-07-04 ENCOUNTER — Ambulatory Visit
Admission: RE | Admit: 2021-07-04 | Discharge: 2021-07-04 | Disposition: A | Discharge status: Home / Self Care | Payer: No Typology Code available for payment source | Source: Ambulatory Visit | Attending: Sports Medicine | Admitting: Sports Medicine

## 2021-07-04 ENCOUNTER — Other Ambulatory Visit: Payer: Self-pay

## 2021-07-04 DIAGNOSIS — M5442 Lumbago with sciatica, left side: Secondary | ICD-10-CM | POA: Insufficient documentation

## 2021-07-04 DIAGNOSIS — M5441 Lumbago with sciatica, right side: Secondary | ICD-10-CM | POA: Diagnosis present

## 2021-07-04 DIAGNOSIS — G8929 Other chronic pain: Secondary | ICD-10-CM | POA: Insufficient documentation

## 2021-07-04 DIAGNOSIS — M47816 Spondylosis without myelopathy or radiculopathy, lumbar region: Secondary | ICD-10-CM | POA: Diagnosis present

## 2021-07-04 DIAGNOSIS — M5136 Other intervertebral disc degeneration, lumbar region: Secondary | ICD-10-CM | POA: Insufficient documentation

## 2021-07-28 ENCOUNTER — Other Ambulatory Visit (HOSPITAL_COMMUNITY): Payer: Self-pay

## 2021-08-18 ENCOUNTER — Telehealth: Payer: No Typology Code available for payment source | Admitting: Physician Assistant

## 2021-08-18 ENCOUNTER — Other Ambulatory Visit: Payer: Self-pay

## 2021-08-18 DIAGNOSIS — R300 Dysuria: Secondary | ICD-10-CM

## 2021-08-18 MED ORDER — CEPHALEXIN 500 MG PO CAPS
500.0000 mg | ORAL_CAPSULE | Freq: Two times a day (BID) | ORAL | 0 refills | Status: AC
  Filled 2021-08-18: qty 14, 7d supply, fill #0

## 2021-08-18 MED ORDER — PHENAZOPYRIDINE HCL 100 MG PO TABS
100.0000 mg | ORAL_TABLET | Freq: Three times a day (TID) | ORAL | 0 refills | Status: DC | PRN
Start: 2021-08-18 — End: 2021-12-26
  Filled 2021-08-18: qty 10, 4d supply, fill #0

## 2021-08-18 NOTE — Progress Notes (Signed)
E-Visit for Urinary Problems  We are sorry that you are not feeling well.  Here is how we plan to help!  Based on what you shared with me it looks like you most likely have a simple urinary tract infection.  A UTI (Urinary Tract Infection) is a bacterial infection of the bladder.  Most cases of urinary tract infections are simple to treat but a key part of your care is to encourage you to drink plenty of fluids and watch your symptoms carefully.  I have prescribed Keflex 500 mg twice a day for 7 days.  Your symptoms should gradually improve. Call us if the burning in your urine worsens, you develop worsening fever, back pain or pelvic pain or if your symptoms do not resolve after completing the antibiotic.  I have also sent in a script for Pyridium to help with burning.   Urinary tract infections can be prevented by drinking plenty of water to keep your body hydrated.  Also be sure when you wipe, wipe from front to back and don't hold it in!  If possible, empty your bladder every 4 hours.  HOME CARE Drink plenty of fluids Compete the full course of the antibiotics even if the symptoms resolve Remember, when you need to go.go. Holding in your urine can increase the likelihood of getting a UTI! GET HELP RIGHT AWAY IF: You cannot urinate You get a high fever Worsening back pain occurs You see blood in your urine You feel sick to your stomach or throw up You feel like you are going to pass out  MAKE SURE YOU  Understand these instructions. Will watch your condition. Will get help right away if you are not doing well or get worse.   Thank you for choosing an e-visit.  Your e-visit answers were reviewed by a board certified advanced clinical practitioner to complete your personal care plan. Depending upon the condition, your plan could have included both over the counter or prescription medications.  Please review your pharmacy choice. Make sure the pharmacy is open so you can pick up  prescription now. If there is a problem, you may contact your provider through [employer] and have the prescription routed to another pharmacy.  Your safety is important to [place]. If you have drug allergies check your prescription carefully.   For the next 24 hours you can use MyChart to ask questions about today's visit, request a non-urgent call back, or ask for a work or school excuse. You will get an email in the next two days asking about your experience. I hope that your e-visit has been valuable and will speed your recovery.

## 2021-08-18 NOTE — Progress Notes (Signed)
I have spent 5 minutes in review of e-visit questionnaire, review and updating patient chart, medical decision making and response to patient.   Arnola Crittendon Cody Madgeline Rayo, PA-C    

## 2021-09-08 ENCOUNTER — Other Ambulatory Visit (HOSPITAL_COMMUNITY): Payer: Self-pay

## 2021-09-08 MED ORDER — CIPROFLOXACIN HCL 500 MG PO TABS
500.0000 mg | ORAL_TABLET | Freq: Two times a day (BID) | ORAL | 0 refills | Status: DC
  Filled 2021-09-08: qty 14, 7d supply, fill #0

## 2021-09-09 ENCOUNTER — Other Ambulatory Visit (HOSPITAL_COMMUNITY): Payer: Self-pay

## 2021-10-10 ENCOUNTER — Telehealth: Payer: No Typology Code available for payment source | Admitting: Physician Assistant

## 2021-10-10 DIAGNOSIS — R3989 Other symptoms and signs involving the genitourinary system: Secondary | ICD-10-CM

## 2021-10-11 MED ORDER — SULFAMETHOXAZOLE-TRIMETHOPRIM 800-160 MG PO TABS: 1.0000 | ORAL_TABLET | Freq: Two times a day (BID) | ORAL | 0 refills | Status: DC

## 2021-10-11 NOTE — Progress Notes (Signed)
I have spent 5 minutes in review of e-visit questionnaire, review and updating patient chart, medical decision making and response to patient.   Jalee Saine Cody Janasha Barkalow, PA-C    

## 2021-10-11 NOTE — Progress Notes (Signed)
E-Visit for Urinary Problems  We are sorry that you are not feeling well.  Here is how we plan to help!  Based on what you shared with me it looks like you most likely have a simple urinary tract infection.  A UTI (Urinary Tract Infection) is a bacterial infection of the bladder.  Most cases of urinary tract infections are simple to treat but a key part of your care is to encourage you to drink plenty of fluids and watch your symptoms carefully.  I have prescribed Bactrim DS One tablet twice a day for 5 days.  Your symptoms should gradually improve. Call us if the burning in your urine worsens, you develop worsening fever, back pain or pelvic pain or if your symptoms do not resolve after completing the antibiotic.Giving recent issue with more frequent UTI, you need to schedule a follow-up with your primary care provider to discuss potential reasons this is occurring and to potentially start a preventive care plan.   Urinary tract infections can be prevented by drinking plenty of water to keep your body hydrated.  Also be sure when you wipe, wipe from front to back and don't hold it in!  If possible, empty your bladder every 4 hours.  HOME CARE Drink plenty of fluids Compete the full course of the antibiotics even if the symptoms resolve Remember, when you need to gogo. Holding in your urine can increase the likelihood of getting a UTI! GET HELP RIGHT AWAY IF: You cannot urinate You get a high fever Worsening back pain occurs You see blood in your urine You feel sick to your stomach or throw up You feel like you are going to pass out  MAKE SURE YOU  Understand these instructions. Will watch your condition. Will get help right away if you are not doing well or get worse.   Thank you for choosing an e-visit.  Your e-visit answers were reviewed by a board certified advanced clinical practitioner to complete your personal care plan. Depending upon the condition, your plan could have  included both over the counter or prescription medications.  Please review your pharmacy choice. Make sure the pharmacy is open so you can pick up prescription now. If there is a problem, you may contact your provider through [employer] and have the prescription routed to another pharmacy.  Your safety is important to [place]. If you have drug allergies check your prescription carefully.   For the next 24 hours you can use MyChart to ask questions about today's visit, request a non-urgent call back, or ask for a work or school excuse. You will get an email in the next two days asking about your experience. I hope that your e-visit has been valuable and will speed your recovery.

## 2021-11-04 ENCOUNTER — Other Ambulatory Visit (HOSPITAL_COMMUNITY): Payer: Self-pay

## 2021-11-05 ENCOUNTER — Other Ambulatory Visit (HOSPITAL_COMMUNITY): Payer: Self-pay

## 2021-12-26 ENCOUNTER — Encounter: Payer: Self-pay | Admitting: Obstetrics and Gynecology

## 2021-12-26 ENCOUNTER — Ambulatory Visit (INDEPENDENT_AMBULATORY_CARE_PROVIDER_SITE_OTHER): Payer: No Typology Code available for payment source | Admitting: Obstetrics and Gynecology

## 2021-12-26 ENCOUNTER — Other Ambulatory Visit: Payer: Self-pay

## 2021-12-26 ENCOUNTER — Other Ambulatory Visit (HOSPITAL_COMMUNITY)
Admission: RE | Admit: 2021-12-26 | Discharge: 2021-12-26 | Disposition: A | Discharge status: Home / Self Care | Payer: No Typology Code available for payment source | Source: Ambulatory Visit | Attending: Obstetrics and Gynecology | Admitting: Obstetrics and Gynecology

## 2021-12-26 VITALS — BP 124/75 | HR 56 | Ht 67.0 in | Wt 136.3 lb

## 2021-12-26 DIAGNOSIS — N951 Menopausal and female climacteric states: Secondary | ICD-10-CM | POA: Diagnosis not present

## 2021-12-26 DIAGNOSIS — Z01419 Encounter for gynecological examination (general) (routine) without abnormal findings: Secondary | ICD-10-CM

## 2021-12-26 DIAGNOSIS — Z124 Encounter for screening for malignant neoplasm of cervix: Secondary | ICD-10-CM

## 2021-12-26 NOTE — Progress Notes (Signed)
? ? ?GYNECOLOGY ANNUAL PHYSICAL EXAM PROGRESS NOTE ? ?Subjective:  ? ? Shelley Davila is a 51 y.o. [id number] female who presents  to establish care and for an annual exam.  Last seen by a GYN 5 years ago. The patient is sexually active. The patient participates in regular exercise: no. Has the patient ever been transfused or tattooed?: no. The patient reports that there is not domestic violence in her life.  ? ?Shelley Davila has the following complaints today:  ?Reports that from October to February she did not have a cycle. During that time, she began noting hot flashes and night sweats.  Also noted that she got 2 UTI's when she had not experienced one since her 15s.  Cycles have resumed and all symptoms have resolved, however now noting that cycles are lasting a little longer, ~ 8 days and are slightly heavier.  ? ? ?Menstrual History: ?Patient's last menstrual period was 12/17/2021 (exact date). ?Period Duration (Days): 8 ?Period Pattern: Regular ?Menstrual Flow: Heavy, Light ?Menstrual Control: Maxi pad, Tampon ?Menstrual Control Change Freq (Hours): 1-3 ?Dysmenorrhea: (!) Moderate ?Dysmenorrhea Symptoms: Cramping ? ? ? ?Gynecologic History:  ?Contraception: none ?History of STI's: Denies ?Last Pap: ~ 5 years ago. Marland Kitchen Results were: normal.  Denies h/o abnormal pap smears. ?Last mammogram: 06/13/2021. Results were: normal ?Last colonoscopy: 11/22/2020.  Results were: normal.  ? ? ?OB History  ?Gravida Para Term Preterm AB Living  ?'2 2 2 '$ 0 0 2  ?SAB IAB Ectopic Multiple Live Births  ?0 0 0 0 2  ?  ?# Outcome Date GA Lbr Len/2nd Weight Sex Delivery Anes PTL Lv  ?2 Term      Vag-Spont   LIV  ?1 Term      Vag-Spont   LIV  ? ? ?Past Medical History:  ?Diagnosis Date  ? Complication of anesthesia   ? hard to wake up  ? Fibroadenoma of both breasts   ? has had multiple lumpectomies and biopsies of both breasts  ? ? ?Past Surgical History:  ?Procedure Laterality Date  ? BREAST BIOPSY Bilateral   ? X2  ? BREAST SURGERY    ? multiple  lumpectomies and biopsies on both breasts  ? COLONOSCOPY WITH PROPOFOL N/A 11/22/2020  ? Procedure: COLONOSCOPY WITH PROPOFOL;  Surgeon: Jonathon Bellows, MD;  Location: [facility] ENDOSCOPY;  Service: Gastroenterology;  Laterality: N/A;  ? EXCISION OF BREAST BIOPSY Right 03/05/2016  ? Procedure: EXCISION OF TWO FIBROADENOMAS RIGHT  BREAST ;  Surgeon: Donnie Mesa, MD;  Location: [facility];  Service: General;  Laterality: Right;  ? ? ?Family History  ?Problem Relation Age of Onset  ? COPD Mother   ? Hypercholesterolemia Mother   ? Arthritis Mother   ? Arthritis Father   ? Breast cancer Neg Hx   ? ? ?Social History  ? ?Socioeconomic History  ? Marital status: Married  ?  Spouse name: Not on file  ? Number of children: Not on file  ? Years of education: Not on file  ? Highest education level: Not on file  ?Occupational History  ? Not on file  ?Tobacco Use  ? Smoking status: Never  ? Smokeless tobacco: Never  ?Vaping Use  ? Vaping Use: Never used  ?Substance and Sexual Activity  ? Alcohol use: Not Currently  ?  Comment: social  ? Drug use: No  ? Sexual activity: Yes  ?  Birth control/protection: Other-see comments  ?  Comment: husband has had vasectomy  ?Other Topics Concern  ?  Not on file  ?Social History Narrative  ? Not on file  ? ?Social Determinants of Health  ? ?Financial Resource Strain: Not on file  ?Food Insecurity: Not on file  ?Transportation Needs: Not on file  ?Physical Activity: Not on file  ?Stress: Not on file  ?Social Connections: Not on file  ?Intimate Partner Violence: Not on file  ? ? ?Current Outpatient Medications on File Prior to Visit  ?Medication Sig Dispense Refill  ? cholecalciferol (VITAMIN D) 1000 units tablet Take 2,000 Units by mouth daily.    ? Menaquinone-7 (VITAMIN K2 PO) Take by mouth.    ? omeprazole (PRILOSEC) 40 MG capsule Take 1 capsule (40 mg total) by mouth once daily 90 capsule 3  ? ?No current facility-administered medications on file prior to visit.  ? ? ?Allergies   ?Allergen Reactions  ? Bee Venom Anaphylaxis  ? ? ? ?Review of Systems ?Constitutional: negative for chills, fatigue, fevers and sweats ?Eyes: negative for irritation, redness and visual disturbance ?Ears, nose, mouth, throat, and face: negative for hearing loss, nasal congestion, snoring and tinnitus ?Respiratory: negative for asthma, cough, sputum ?Cardiovascular: negative for chest pain, dyspnea, exertional chest pressure/discomfort, irregular heart beat, palpitations and syncope ?Gastrointestinal: negative for abdominal pain, change in bowel habits, nausea and vomiting ?Genitourinary: negative for abnormal menstrual periods, genital lesions, sexual problems and vaginal discharge, dysuria and urinary incontinence ?Integument/breast: negative for breast lump, breast tenderness and nipple discharge ?Hematologic/lymphatic: negative for bleeding and easy bruising ?Musculoskeletal:negative for back pain and muscle weakness ?Neurological: negative for dizziness, headaches, vertigo and weakness ?Endocrine: negative for diabetic symptoms including polydipsia, polyuria and skin dryness ?Allergic/Immunologic: negative for hay fever and urticaria    ? ? ?Objective:  ?Blood pressure 124/75, pulse (!) 56, height '5\' 7"'$  (1.702 m), weight 136 lb 4.8 oz (61.8 kg), last menstrual period 12/17/2021.  Body mass index is 21.35 kg/m?. ? ?  ?General Appearance:    Alert, cooperative, no distress, appears stated age  ?Head:    Normocephalic, without obvious abnormality, atraumatic  ?Eyes:    PERRL, conjunctiva/corneas clear, EOM's intact, both eyes  ?Ears:    Normal external ear canals, both ears  ?Nose:   Nares normal, septum midline, mucosa normal, no drainage or sinus tenderness  ?Throat:   Lips, mucosa, and tongue normal; teeth and gums normal  ?Neck:   Supple, symmetrical, trachea midline, no adenopathy; thyroid: no enlargement/tenderness/nodules; no carotid bruit or JVD  ?Back:     Symmetric, no curvature, ROM normal, no CVA  tenderness  ?Lungs:     Clear to auscultation bilaterally, respirations unlabored  ?Chest Wall:    No tenderness or deformity  ? Heart:    Regular rate and rhythm, S1 and S2 normal, no murmur, rub or gallop  ?Breast Exam:    No tenderness, masses, or nipple abnormality. Fibrous breast tissue bilaterally.   ?Abdomen:     Soft, non-tender, bowel sounds active all four quadrants, no masses, no organomegaly.    ?Genitalia:    Pelvic:external genitalia normal, vagina without lesions, discharge, or tenderness, rectovaginal septum  normal. Cervix with nabothian cyst at 12 o'clock.  Otherwise in appearance, no cervical motion tenderness. No adnexal masses or tenderness.  Uterus normal size, shape, mobile, regular contours, nontender.  ?Rectal:    Normal external sphincter.  No hemorrhoids appreciated. Internal exam not done.   ?Extremities:   Extremities normal, atraumatic, no cyanosis or edema  ?Pulses:   2+ and symmetric all extremities  ?Skin:   Skin color,  texture, turgor normal, no rashes or lesions  ?Lymph nodes:   Cervical, supraclavicular, and axillary nodes normal  ?Neurologic:   CNII-XII intact, normal strength, sensation and reflexes throughout  ? ?. ? ?Labs:  ?Labs reviewed in Care Everywhere ? ?Assessment:  ? ?1. Well woman exam with routine gynecological exam   ?2. Cervical cancer screening   ?3. Perimenopausal   ? ?  ?Plan:  ?Blood tests: None ordered. Has labs performed by PCP. ?Breast self exam technique reviewed and patient encouraged to perform self-exam monthly. ?Contraception: none. ?Discussed healthy lifestyle modifications. ?Mammogram  due in September.  ?Pap smear ordered. ?COVID vaccination status: completed Moderna series. Is eligible for booster.  ?Discussed perimenopause, if patient becomes symptomatic again, can utilize herbal remedies, non-hormonal or HRT prescription medications.  ?Follow up in 1-2 years for annual exam ? ? ?Rubie Maid, MD ?[facility] ? ?

## 2021-12-30 LAB — CYTOLOGY - PAP
Comment: NEGATIVE
Diagnosis: NEGATIVE
High risk HPV: NEGATIVE

## 2022-01-03 DIAGNOSIS — D239 Other benign neoplasm of skin, unspecified: Secondary | ICD-10-CM

## 2022-01-03 HISTORY — DX: Other benign neoplasm of skin, unspecified: D23.9

## 2022-01-05 ENCOUNTER — Other Ambulatory Visit: Payer: Self-pay

## 2022-01-05 ENCOUNTER — Encounter: Payer: Self-pay | Admitting: Dermatology

## 2022-01-05 ENCOUNTER — Ambulatory Visit: Payer: No Typology Code available for payment source | Admitting: Dermatology

## 2022-01-05 DIAGNOSIS — L738 Other specified follicular disorders: Secondary | ICD-10-CM

## 2022-01-05 DIAGNOSIS — D1800 Hemangioma unspecified site: Secondary | ICD-10-CM

## 2022-01-05 DIAGNOSIS — B078 Other viral warts: Secondary | ICD-10-CM | POA: Diagnosis not present

## 2022-01-05 DIAGNOSIS — D229 Melanocytic nevi, unspecified: Secondary | ICD-10-CM

## 2022-01-05 DIAGNOSIS — D239 Other benign neoplasm of skin, unspecified: Secondary | ICD-10-CM

## 2022-01-05 DIAGNOSIS — C439 Malignant melanoma of skin, unspecified: Secondary | ICD-10-CM

## 2022-01-05 DIAGNOSIS — D225 Melanocytic nevi of trunk: Secondary | ICD-10-CM

## 2022-01-05 DIAGNOSIS — D2362 Other benign neoplasm of skin of left upper limb, including shoulder: Secondary | ICD-10-CM

## 2022-01-05 DIAGNOSIS — L814 Other melanin hyperpigmentation: Secondary | ICD-10-CM

## 2022-01-05 DIAGNOSIS — Z86018 Personal history of other benign neoplasm: Secondary | ICD-10-CM

## 2022-01-05 DIAGNOSIS — D492 Neoplasm of unspecified behavior of bone, soft tissue, and skin: Secondary | ICD-10-CM

## 2022-01-05 DIAGNOSIS — L821 Other seborrheic keratosis: Secondary | ICD-10-CM

## 2022-01-05 DIAGNOSIS — Z1283 Encounter for screening for malignant neoplasm of skin: Secondary | ICD-10-CM

## 2022-01-05 DIAGNOSIS — L578 Other skin changes due to chronic exposure to nonionizing radiation: Secondary | ICD-10-CM | POA: Diagnosis not present

## 2022-01-05 DIAGNOSIS — D0372 Melanoma in situ of left lower limb, including hip: Secondary | ICD-10-CM

## 2022-01-05 DIAGNOSIS — L820 Inflamed seborrheic keratosis: Secondary | ICD-10-CM

## 2022-01-05 HISTORY — DX: Malignant melanoma of skin, unspecified: C43.9

## 2022-01-05 NOTE — Progress Notes (Signed)
? ?New Patient Visit ? ?Subjective  ?Shelley Davila is a 51 y.o. female who presents for the following: Annual Exam (No hx of skin cancer. Hx of DN in the past) and Warts (Right elbow. Has used OTC Wart treatments, no help). ?The patient presents for Total-Body Skin Exam (TBSE) for skin cancer screening and mole check.  The patient has spots, moles and lesions to be evaluated, some may be new or changing and the patient has concerns that these could be cancer. ? ?Review of Systems: No other skin or systemic complaints except as noted in HPI or Assessment and Plan. ? ?Objective  ?Well appearing patient in no apparent distress; mood and affect are within normal limits. ? ?A full examination was performed including scalp, head, eyes, ears, nose, lips, neck, chest, axillae, abdomen, back, buttocks, bilateral upper extremities, bilateral lower extremities, hands, feet, fingers, toes, fingernails, and toenails. All findings within normal limits unless otherwise noted below. ? ?Right Elbow - Posterior ?Erythematous keratotic or waxy stuck-on papule or plaque. 0.7 cm ? ?Left volar forearm ?Firm pink/brown papulenodule with dimple sign. 0.6 x 0.4 cm ? ? ? ? ?Left Medial Breast ?0.4 x 0.3 cm medium brown papule ? ? ? ? ?left mid lateral pretibial ?0.7 x 0.5 cm irregular medium brown macule ? ? ? ? ?Right 2nd Finger Tip x1 ?0.2 cm verrucous papule -- Discussed viral etiology and contagion.  ? ? ?Assessment & Plan  ?Lentigines ?- Scattered tan macules ?- Due to sun exposure ?- Benign-appearing, observe ?- Recommend daily broad spectrum sunscreen SPF 30+ to sun-exposed areas, reapply every 2 hours as needed. ?- Call for any changes ? ?Seborrheic Keratoses ?- Stuck-on, waxy, tan-brown papules and/or plaques  ?- Benign-appearing ?- Discussed benign etiology and prognosis. ?- Observe ?- Call for any changes ? ?Melanocytic Nevi ?- Tan-brown and/or pink-flesh-colored symmetric macules and papules ?- Benign appearing on exam today ?-  Observation ?- Call clinic for new or changing moles ?- Recommend daily use of broad spectrum spf 30+ sunscreen to sun-exposed areas.  ? ?Hemangiomas ?- Red papules ?- Discussed benign nature ?- Observe ?- Call for any changes ? ?Actinic Damage ?- Chronic condition, secondary to cumulative UV/sun exposure ?- diffuse scaly erythematous macules with underlying dyspigmentation ?- Recommend daily broad spectrum sunscreen SPF 30+ to sun-exposed areas, reapply every 2 hours as needed.  ?- Staying in the shade or wearing long sleeves, sun glasses (UVA+UVB protection) and wide brim hats (4-inch brim around the entire circumference of the hat) are also recommended for sun protection.  ?- Call for new or changing lesions. ? ?Skin cancer screening performed today. ? ?History of Dysplastic Nevus ?- No evidence of recurrence today on back ?- Recommend regular full body skin exams ?- Recommend daily broad spectrum sunscreen SPF 30+ to sun-exposed areas, reapply every 2 hours as needed.  ?- Call if any new or changing lesions are noted between office visits  ? ?Sebaceous Hyperplasia ?- Small yellow papules with a central dell at face ?- Benign ?- Observe ?- Discussed treatment options. ? ?Inflamed seborrheic keratosis ?Right Elbow - Posterior ?Vs Verruca Mertha Finders ?Destruction of lesion - Right Elbow - Posterior ?Complexity: simple   ?Destruction method: cryotherapy   ?Informed consent: discussed and consent obtained   ?Timeout:  patient name, date of birth, surgical site, and procedure verified ?Lesion destroyed using liquid nitrogen: Yes   ?Region frozen until ice ball extended beyond lesion: Yes   ?Outcome: patient tolerated procedure well with no complications   ?  Post-procedure details: wound care instructions given   ? ?Dermatofibroma ?Left volar forearm ?A dermatofibroma is a benign growth possibly related to trauma, such as an insect bite or inflamed acne-type bump.  Discussed removal (shave vrs excision) with resulting scar  and risk of recurrence.  Since not bothersome, will observe for now. ?Benign-appearing.  Observation.  Call clinic for new or changing lesions.  Recommend daily use of broad spectrum spf 30+ sunscreen to sun-exposed areas.  ? ?Nevus ?Left Medial Breast ?See photo ?Benign-appearing.  Observation.  Call clinic for new or changing lesions.  Recommend daily use of broad spectrum spf 30+ sunscreen to sun-exposed areas.   ? ?Neoplasm of skin ?left mid lateral pretibial ?Epidermal / dermal shaving ? ?Lesion diameter (cm):  0.7 ?Informed consent: discussed and consent obtained   ?Timeout: patient name, date of birth, surgical site, and procedure verified   ?Procedure prep:  Patient was prepped and draped in usual sterile fashion ?Prep type:  Isopropyl alcohol ?Anesthesia: the lesion was anesthetized in a standard fashion   ?Anesthetic:  1% lidocaine w/ epinephrine 1-100,000 buffered w/ 8.4% NaHCO3 ?Instrument used: flexible razor blade   ?Hemostasis achieved with: pressure, aluminum chloride and electrodesiccation   ?Outcome: patient tolerated procedure well   ?Post-procedure details: sterile dressing applied and wound care instructions given   ?Dressing type: bandage and petrolatum   ? ?Specimen 1 - Surgical pathology ?Differential Diagnosis: R/O atypia ?Check Margins: No ? ?Other viral warts ?Right 2nd Finger Tip x1 ?Discussed viral etiology and risk of spread.  Discussed multiple treatments may be required to clear warts.  Discussed possible post-treatment dyspigmentation and risk of recurrence. ? ?Destruction of lesion - Right 2nd Finger Tip x1 ?Complexity: simple   ?Destruction method: cryotherapy   ?Informed consent: discussed and consent obtained   ?Timeout:  patient name, date of birth, surgical site, and procedure verified ?Lesion destroyed using liquid nitrogen: Yes   ?Region frozen until ice ball extended beyond lesion: Yes   ?Outcome: patient tolerated procedure well with no complications   ?Post-procedure  details: wound care instructions given   ? ?Return in about 1 year (around 01/06/2023) for TBSE, 2 month ISK/Wart Recheck. ? ?I, Emelia Salisbury, CMA, am acting as scribe for Sarina Ser, MD. ?Documentation: I have reviewed the above documentation for accuracy and completeness, and I agree with the above. ? ?Sarina Ser, MD ? ? ?

## 2022-01-05 NOTE — Patient Instructions (Addendum)
Cryotherapy Aftercare ? ?Wash gently with soap and water everyday.   ?Apply Vaseline and Band-Aid daily until healed.  ? ?Prior to procedure, discussed risks of blister formation, small wound, skin dyspigmentation, or rare scar following cryotherapy. Recommend Vaseline ointment to treated areas while healing.  ? ?Wound Care Instructions ? ?Cleanse wound gently with soap and water once a day then pat dry with clean gauze. Apply a thing coat of Petrolatum (petroleum jelly, "Vaseline") over the wound (unless you have an allergy to this). We recommend that you use a new, sterile tube of Vaseline. Do not pick or remove scabs. Do not remove the yellow or white "healing tissue" from the base of the wound. ? ?Cover the wound with fresh, clean, nonstick gauze and secure with paper tape. You may use Band-Aids in place of gauze and tape if the would is small enough, but would recommend trimming much of the tape off as there is often too much. Sometimes Band-Aids can irritate the skin. ? ?You should call the office for your biopsy report after 1 week if you have not already been contacted. ? ?If you experience any problems, such as abnormal amounts of bleeding, swelling, significant bruising, significant pain, or evidence of infection, please call the office immediately. ? ?FOR ADULT SURGERY PATIENTS: If you need something for pain relief you may take 1 extra strength Tylenol (acetaminophen) AND 2 Ibuprofen ('200mg'$  each) together every 4 hours as needed for pain. (do not take these if you are allergic to them or if you have a reason you should not take them.) Typically, you may only need pain medication for 1 to 3 days.  ? ? ? ?Recommend daily broad spectrum sunscreen SPF 30+ to sun-exposed areas, reapply every 2 hours as needed. Call for new or changing lesions.  ?Staying in the shade or wearing long sleeves, sun glasses (UVA+UVB protection) and wide brim hats (4-inch brim around the entire circumference of the hat) are also  recommended for sun protection.  ? ?Melanoma ABCDEs ? ?Melanoma is the most dangerous type of skin cancer, and is the leading cause of death from skin disease.  You are more likely to develop melanoma if you: ?Have light-colored skin, light-colored eyes, or red or blond hair ?Spend a lot of time in the sun ?Tan regularly, either outdoors or in a tanning bed ?Have had blistering sunburns, especially during childhood ?Have a close family member who has had a melanoma ?Have atypical moles or large birthmarks ? ?Early detection of melanoma is key since treatment is typically straightforward and cure rates are extremely high if we catch it early.  ? ?The first sign of melanoma is often a change in a mole or a new dark spot.  The ABCDE system is a way of remembering the signs of melanoma. ? ?A for asymmetry:  The two halves do not match. ?B for border:  The edges of the growth are irregular. ?C for color:  A mixture of colors are present instead of an even brown color. ?D for diameter:  Melanomas are usually (but not always) greater than 98m - the size of a pencil eraser. ?E for evolution:  The spot keeps changing in size, shape, and color. ? ?Please check your skin once per month between visits. You can use a small mirror in front and a large mirror behind you to keep an eye on the back side or your body.  ? ?If you see any new or changing lesions before your next follow-up,  please call to schedule a visit. ? ?Please continue daily skin protection including broad spectrum sunscreen SPF 30+ to sun-exposed areas, reapplying every 2 hours as needed when you're outdoors.  ? ?Staying in the shade or wearing long sleeves, sun glasses (UVA+UVB protection) and wide brim hats (4-inch brim around the entire circumference of the hat) are also recommended for sun protection.   ? ?If You Need Anything After Your Visit ? ?If you have any questions or concerns for your doctor, please call our main line at [phone] and press option  4 to reach your doctor's medical assistant. If no one answers, please leave a voicemail as directed and we will return your call as soon as possible. Messages left after 4 pm will be answered the following business day.  ? ?You may also send [place] a message via MyChart. We typically respond to MyChart messages within 1-2 business days. ? ?For prescription refills, please ask your pharmacy to contact our office. Our fax number is [phone]. ? ?If you have an urgent issue when the clinic is closed that cannot wait until the next business day, you can page your doctor at the number below.   ? ?Please note that while we do our best to be available for urgent issues outside of office hours, we are not available 24/7.  ? ?If you have an urgent issue and are unable to reach [place], you may choose to seek medical care at your doctor's office, retail clinic, urgent care center, or emergency room. ? ?If you have a medical emergency, please immediately call 911 or go to the emergency department. ? ?Pager Numbers ? ?- Dr. Nehemiah Massed: [phone] ? ?- Dr. Laurence Ferrari: [phone] ? ?- Dr. Nicole Kindred: [phone] ? ?In the event of inclement weather, please call our main line at [phone] for an update on the status of any delays or closures. ? ?Dermatology Medication Tips: ?Please keep the boxes that topical medications come in in order to help keep track of the instructions about where and how to use these. Pharmacies typically print the medication instructions only on the boxes and not directly on the medication tubes.  ? ?If your medication is too expensive, please contact our office at [phone] option 4 or send [place] a message through [place].  ? ?We are unable to tell what your co-pay for medications will be in advance as this is different depending on your insurance coverage. However, we may be able to find a substitute medication at lower cost or fill out paperwork to get insurance to cover a needed medication.  ? ?If a prior  authorization is required to get your medication covered by your insurance company, please allow [place] 1-2 business days to complete this process. ? ?Drug prices often vary depending on where the prescription is filled and some pharmacies may offer cheaper prices. ? ?The website www.goodrx.com contains coupons for medications through different pharmacies. The prices here do not account for what the cost may be with help from insurance (it may be cheaper with your insurance), but the website can give you the price if you did not use any insurance.  ?- You can print the associated coupon and take it with your prescription to the pharmacy.  ?- You may also stop by our office during regular business hours and pick up a GoodRx coupon card.  ?- If you need your prescription sent electronically to a different pharmacy, notify our office through [facility] or by phone at [phone] option 4. ? ? ? ? ?  Si Usted Necesita Algo Despu?s de Su Visita ? ?Tambi?n puede enviarnos un mensaje a trav?s de MyChart. Por lo general respondemos a los mensajes de MyChart en el transcurso de 1 a 2 d?as h?biles. ? ?Para renovar recetas, por favor pida a su farmacia que se ponga en contacto con nuestra oficina. Nuestro n?mero de fax es el [phone]. ? ?Si tiene un asunto urgente cuando la cl?nica est? cerrada y que no puede esperar hasta el siguiente d?a h?bil, puede llamar/localizar a su doctor(a) al n?mero que aparece a continuaci?n.  ? ?Por favor, tenga en cuenta que aunque hacemos todo lo posible para estar disponibles para asuntos urgentes fuera del horario de oficina, no estamos disponibles las 24 horas del d?a, los 7 d?as de la semana.  ? ?Si tiene un problema urgente y no puede comunicarse con nosotros, puede optar por buscar atenci?n m?dica  en el consultorio de su doctor(a), en una cl?nica privada, en un centro de atenci?n urgente o en una sala de emergencias. ? ?Si tiene Engineer, maintenance (IT) m?dica, por favor llame  inmediatamente al 911 o vaya a la sala de emergencias. ? ?N?meros de b?per ? ?- Dr. Nehemiah Massed: [phone] ? ?- Dra. Moye: [phone] ? ?- Dra. Nicole Kindred: [phone] ? ?En caso de inclemencias del tiempo, por favor ll

## 2022-01-06 ENCOUNTER — Encounter: Payer: Self-pay | Admitting: Dermatology

## 2022-01-08 ENCOUNTER — Telehealth: Payer: Self-pay

## 2022-01-08 NOTE — Telephone Encounter (Signed)
Dr. Nehemiah Massed discussed bx results with pt and I called and scheduled her for surgery./sh ?

## 2022-01-08 NOTE — Telephone Encounter (Signed)
-----   Message from Ralene Bathe, MD sent at 01/08/2022  5:21 PM EDT ----- ?Diagnosis ?Skin , left mid lateral pretibial ?MELANOMA IN SITU, LENTIGO MALIGNA TYPE, MARGIN CLOSE ? ?Cancer - Melanoma in situ ?Superficial and early ?Needs wide local excision - please schedule surgery ?And  ?Discussion of Melanoma matrix ?Discussed this by phone with pt today. ?Pt is awaiting call from MA to schedule surgery ?

## 2022-01-19 ENCOUNTER — Telehealth: Payer: Self-pay

## 2022-01-19 NOTE — Telephone Encounter (Signed)
Patient called about upcoming Melanoma surgery with you. She has the [place] where she has to enter a "authorization" on her end for the procedure and coverage for the appointment.  ? ?Can you give a estimate/idea of what codes you will be using for this?  ?

## 2022-01-19 NOTE — Telephone Encounter (Signed)
Patient gave me the OK to leave detailed msg if she did not answer when I called her back. ? ?Left detailed voicemail with CPT codes and ICD 10 code that will be used.  ? ?

## 2022-02-16 ENCOUNTER — Other Ambulatory Visit (HOSPITAL_COMMUNITY): Payer: Self-pay

## 2022-02-23 ENCOUNTER — Ambulatory Visit: Payer: No Typology Code available for payment source | Admitting: Dermatology

## 2022-03-24 ENCOUNTER — Other Ambulatory Visit: Payer: Self-pay

## 2022-03-24 ENCOUNTER — Telehealth: Payer: Self-pay

## 2022-03-24 ENCOUNTER — Ambulatory Visit: Payer: No Typology Code available for payment source | Admitting: Dermatology

## 2022-03-24 ENCOUNTER — Encounter: Payer: Self-pay | Admitting: Dermatology

## 2022-03-24 DIAGNOSIS — D0372 Melanoma in situ of left lower limb, including hip: Secondary | ICD-10-CM | POA: Diagnosis not present

## 2022-03-24 DIAGNOSIS — D2261 Melanocytic nevi of right upper limb, including shoulder: Secondary | ICD-10-CM | POA: Diagnosis not present

## 2022-03-24 DIAGNOSIS — D229 Melanocytic nevi, unspecified: Secondary | ICD-10-CM

## 2022-03-24 DIAGNOSIS — D225 Melanocytic nevi of trunk: Secondary | ICD-10-CM

## 2022-03-24 MED ORDER — MUPIROCIN 2 % EX OINT
1.0000 "application " | TOPICAL_OINTMENT | Freq: Every day | CUTANEOUS | 1 refills | Status: DC
  Filled 2022-03-24: qty 22, 20d supply, fill #0

## 2022-03-24 MED ORDER — DOXYCYCLINE HYCLATE 100 MG PO TABS
100.0000 mg | ORAL_TABLET | Freq: Two times a day (BID) | ORAL | 0 refills | Status: AC
  Filled 2022-03-24: qty 14, 7d supply, fill #0

## 2022-03-24 NOTE — Progress Notes (Signed)
Follow-Up Visit   Subjective  Shelley Davila is a 51 y.o. female who presents for the following: Melanoma IS Lentigo Maligna type, bx proven (L mid lat pretibial, pt presents for excision). Pt has a mole on abdomen she wants checked and one on back that is irritating.  Patient accompanied by her husband.  The following portions of the chart were reviewed this encounter and updated as appropriate:   Tobacco  Allergies  Meds  Problems  Med Hx  Surg Hx  Fam Hx     Review of Systems:  No other skin or systemic complaints except as noted in HPI or Assessment and Plan.  Objective  Well appearing patient in no apparent distress; mood and affect are within normal limits.  A focused examination was performed including L lower leg. Relevant physical exam findings are noted in the Assessment and Plan.  L mid lateral pretibial Pink bx site 2.1 x 1.5cm  Abdomen, R post shoulder Slightly irregular brown macule abdomen Irritated brown macule R post shoulder   Assessment & Plan  Melanoma in situ of left lower extremity including hip (HCC) L mid lateral pretibial  Skin excision  Lesion length (cm):  0.7 Lesion width (cm):  1.5 Margin per side (cm):  0.5 Total excision diameter (cm):  2.5 Informed consent: discussed and consent obtained   Timeout: patient name, date of birth, surgical site, and procedure verified   Procedure prep:  Patient was prepped and draped in usual sterile fashion Prep type:  Isopropyl alcohol and povidone-iodine Anesthesia: the lesion was anesthetized in a standard fashion   Anesthetic:  1% lidocaine w/ epinephrine 1-100,000 buffered w/ 8.4% NaHCO3 (9cc lido w/ epi, 7cc bupivicaine, Total of 16cc) Instrument used: #15 blade   Hemostasis achieved with: pressure   Hemostasis achieved with comment:  Electrocautery Outcome: patient tolerated procedure well with no complications   Post-procedure details: sterile dressing applied and wound care instructions  given   Dressing type: bandage, pressure dressing and bacitracin (Mupirocin)    Skin repair Complexity:  Complex Final length (cm):  4 Reason for type of repair: reduce tension to allow closure, reduce the risk of dehiscence, infection, and necrosis, reduce subcutaneous dead space and avoid a hematoma, allow closure of the large defect, preserve normal anatomy, preserve normal anatomical and functional relationships and enhance both functionality and cosmetic results   Undermining: area extensively undermined   Undermining comment:  Undermining Defect 3.1cm Subcutaneous layers (deep stitches):  Suture size:  2-0 Suture type: Vicryl (polyglactin 910)   Subcutaneous suture technique: Inverted Dermal. Fine/surface layer approximation (top stitches):  Suture size:  2-0 Suture type: nylon   Stitches: vertical mattress and simple running   Suture removal (days):  7 Hemostasis achieved with: pressure Outcome: patient tolerated procedure well with no complications   Post-procedure details: sterile dressing applied and wound care instructions given   Dressing type: bandage, pressure dressing and bacitracin (Mupirocin)   Additional details:  2 vertical mattress and a simple running  mupirocin ointment (BACTROBAN) 2 % Apply 1 application  topically daily. Qd to excision site on left leg  doxycycline (VIBRA-TABS) 100 MG tablet Take 1 tablet (100 mg total) by mouth 2 (two) times daily. With food and plenty of fluid  Specimen 1 - Surgical pathology Differential Diagnosis: D03.72 Bx proven Melanoma In Situ Lentigo Maligna type  Check Margins: yes Pink bx site 2.1 x 1.5cm [id number]  Lentigo Maligna type, bx proven, excised today  Start Mupirocin oint qd to excision  site,  Start Doxycycline 100 mg 1 po bid with food and plenty of fluid x 1 week   Discussed diagnosis in detail including significance of melanoma diagnosis which can be potentially lethal.  Discussed treatment  recommendations in detail advising that treatment recommendations are based on longitudinal studies and retrospective studies and are nationwide protocols.  Advised there is always potential for recurrence even after definitive treatment.  After definitive treatment, we recommend total-body skin exams every 3 months for a year; then every 4 months for a year; then every 6 months for 3 years.  At 5 years post treatment, if all looks good we would recommend at least yearly total-body skin exams for the rest of your life.  The patient was given time for questions and these were answered.  We recommend frequent self skin examinations; photoprotection with sunscreen, sun protective clothing, hats, sunglasses and sun avoidance.  If the patient notices any new or changing skin lesions the patient should return to the office immediately for evaluation.    Doxycycline should be taken with food to prevent nausea. Do not lay down for 30 minutes after taking. Be cautious with sun exposure and use good sun protection while on this medication. Pregnant women should not take this medication.    Nevus Abdomen, R post shoulder  Plan shave removal on f/u for both   Return in about 1 week (around 03/31/2022) for suture removal, 22mf/u for ISK/wart R elbow, bx x 2, 321mBSE.  I, SoOthelia PullingRMA, am acting as scribe for DaSarina SerMD . Documentation: I have reviewed the above documentation for accuracy and completeness, and I agree with the above.  DaSarina SerMD

## 2022-03-24 NOTE — Patient Instructions (Signed)

## 2022-03-24 NOTE — Telephone Encounter (Signed)
Patient doing fine after today's surgery./sh 

## 2022-03-31 ENCOUNTER — Ambulatory Visit (INDEPENDENT_AMBULATORY_CARE_PROVIDER_SITE_OTHER): Payer: No Typology Code available for payment source | Admitting: Dermatology

## 2022-03-31 DIAGNOSIS — D225 Melanocytic nevi of trunk: Secondary | ICD-10-CM | POA: Diagnosis not present

## 2022-03-31 DIAGNOSIS — D229 Melanocytic nevi, unspecified: Secondary | ICD-10-CM

## 2022-03-31 DIAGNOSIS — L821 Other seborrheic keratosis: Secondary | ICD-10-CM

## 2022-03-31 DIAGNOSIS — L578 Other skin changes due to chronic exposure to nonionizing radiation: Secondary | ICD-10-CM | POA: Diagnosis not present

## 2022-03-31 DIAGNOSIS — B079 Viral wart, unspecified: Secondary | ICD-10-CM

## 2022-03-31 DIAGNOSIS — D239 Other benign neoplasm of skin, unspecified: Secondary | ICD-10-CM

## 2022-03-31 DIAGNOSIS — L820 Inflamed seborrheic keratosis: Secondary | ICD-10-CM

## 2022-03-31 DIAGNOSIS — L814 Other melanin hyperpigmentation: Secondary | ICD-10-CM | POA: Diagnosis not present

## 2022-03-31 DIAGNOSIS — Z1283 Encounter for screening for malignant neoplasm of skin: Secondary | ICD-10-CM | POA: Diagnosis not present

## 2022-03-31 DIAGNOSIS — D485 Neoplasm of uncertain behavior of skin: Secondary | ICD-10-CM

## 2022-03-31 DIAGNOSIS — D2362 Other benign neoplasm of skin of left upper limb, including shoulder: Secondary | ICD-10-CM

## 2022-03-31 DIAGNOSIS — Z86006 Personal history of melanoma in-situ: Secondary | ICD-10-CM | POA: Diagnosis not present

## 2022-03-31 DIAGNOSIS — D2262 Melanocytic nevi of left upper limb, including shoulder: Secondary | ICD-10-CM | POA: Diagnosis not present

## 2022-03-31 DIAGNOSIS — D1800 Hemangioma unspecified site: Secondary | ICD-10-CM

## 2022-03-31 HISTORY — DX: Other benign neoplasm of skin, unspecified: D23.9

## 2022-03-31 NOTE — Patient Instructions (Addendum)
Wound Care Instructions  Cleanse wound gently with soap and water once a day then pat dry with clean gauze. Apply a thing coat of Petrolatum (petroleum jelly, "Vaseline") over the wound (unless you have an allergy to this). We recommend that you use a new, sterile tube of Vaseline. Do not pick or remove scabs. Do not remove the yellow or white "healing tissue" from the base of the wound.  Cover the wound with fresh, clean, nonstick gauze and secure with paper tape. You may use Band-Aids in place of gauze and tape if the would is small enough, but would recommend trimming much of the tape off as there is often too much. Sometimes Band-Aids can irritate the skin.  You should call the office for your biopsy report after 1 week if you have not already been contacted.  If you experience any problems, such as abnormal amounts of bleeding, swelling, significant bruising, significant pain, or evidence of infection, please call the office immediately.  FOR ADULT SURGERY PATIENTS: If you need something for pain relief you may take 1 extra strength Tylenol (acetaminophen) AND 2 Ibuprofen (200mg each) together every 4 hours as needed for pain. (do not take these if you are allergic to them or if you have a reason you should not take them.) Typically, you may only need pain medication for 1 to 3 days.    Due to recent changes in healthcare laws, you may see results of your pathology and/or laboratory studies on MyChart before the doctors have had a chance to review them. We understand that in some cases there may be results that are confusing or concerning to you. Please understand that not all results are received at the same time and often the doctors may need to interpret multiple results in order to provide you with the best plan of care or course of treatment. Therefore, we ask that you please give us 2 business days to thoroughly review all your results before contacting the office for clarification. Should we  see a critical lab result, you will be contacted sooner.   If You Need Anything After Your Visit  If you have any questions or concerns for your doctor, please call our main line at [phone] and press option 4 to reach your doctor's medical assistant. If no one answers, please leave a voicemail as directed and we will return your call as soon as possible. Messages left after 4 pm will be answered the following business day.   You may also send us a message via MyChart. We typically respond to MyChart messages within 1-2 business days.  For prescription refills, please ask your pharmacy to contact our office. Our fax number is [phone].  If you have an urgent issue when the clinic is closed that cannot wait until the next business day, you can page your doctor at the number below.    Please note that while we do our best to be available for urgent issues outside of office hours, we are not available 24/7.   If you have an urgent issue and are unable to reach us, you may choose to seek medical care at your doctor's office, retail clinic, urgent care center, or emergency room.  If you have a medical emergency, please immediately call 911 or go to the emergency department.  Pager Numbers  - Dr. Kowalski: [phone]  - Dr. Moye: [phone]  - Dr. Stewart: [phone]  In the event of inclement weather, please call our main line at [phone]   for an update on the status of any delays or closures.  Dermatology Medication Tips: Please keep the boxes that topical medications come in in order to help keep track of the instructions about where and how to use these. Pharmacies typically print the medication instructions only on the boxes and not directly on the medication tubes.   If your medication is too expensive, please contact our office at [phone] option 4 or send us a message through MyChart.   We are unable to tell what your co-pay for medications will be in advance  as this is different depending on your insurance coverage. However, we may be able to find a substitute medication at lower cost or fill out paperwork to get insurance to cover a needed medication.   If a prior authorization is required to get your medication covered by your insurance company, please allow us 1-2 business days to complete this process.  Drug prices often vary depending on where the prescription is filled and some pharmacies may offer cheaper prices.  The website www.goodrx.com contains coupons for medications through different pharmacies. The prices here do not account for what the cost may be with help from insurance (it may be cheaper with your insurance), but the website can give you the price if you did not use any insurance.  - You can print the associated coupon and take it with your prescription to the pharmacy.  - You may also stop by our office during regular business hours and pick up a GoodRx coupon card.  - If you need your prescription sent electronically to a different pharmacy, notify our office through [facility] MyChart or by phone at [phone] option 4.     Si Usted Necesita Algo Despus de Su Visita  Tambin puede enviarnos un mensaje a travs de MyChart. Por lo general respondemos a los mensajes de MyChart en el transcurso de 1 a 2 das hbiles.  Para renovar recetas, por favor pida a su farmacia que se ponga en contacto con nuestra oficina. Nuestro nmero de fax es el [phone].  Si tiene un asunto urgente cuando la clnica est cerrada y que no puede esperar hasta el siguiente da hbil, puede llamar/localizar a su doctor(a) al nmero que aparece a continuacin.   Por favor, tenga en cuenta que aunque hacemos todo lo posible para estar disponibles para asuntos urgentes fuera del horario de oficina, no estamos disponibles las 24 horas del da, los 7 das de la semana.   Si tiene un problema urgente y no puede comunicarse con nosotros, puede optar  por buscar atencin mdica  en el consultorio de su doctor(a), en una clnica privada, en un centro de atencin urgente o en una sala de emergencias.  Si tiene una emergencia mdica, por favor llame inmediatamente al 911 o vaya a la sala de emergencias.  Nmeros de bper  - Dr. Kowalski: [phone]  - Dra. Moye: [phone]  - Dra. Stewart: [phone]  En caso de inclemencias del tiempo, por favor llame a nuestra lnea principal al [phone] para una actualizacin sobre el estado de cualquier retraso o cierre.  Consejos para la medicacin en dermatologa: Por favor, guarde las cajas en las que vienen los medicamentos de uso tpico para ayudarle a seguir las instrucciones sobre dnde y cmo usarlos. Las farmacias generalmente imprimen las instrucciones del medicamento slo en las cajas y no directamente en los tubos del medicamento.   Si su medicamento es muy caro, por favor, pngase en contacto con nuestra   oficina llamando al [phone] y presione la opcin 4 o envenos un mensaje a travs de MyChart.   No podemos decirle cul ser su copago por los medicamentos por adelantado ya que esto es diferente dependiendo de la cobertura de su seguro. Sin embargo, es posible que podamos encontrar un medicamento sustituto a menor costo o llenar un formulario para que el seguro cubra el medicamento que se considera necesario.   Si se requiere una autorizacin previa para que su compaa de seguros cubra su medicamento, por favor permtanos de 1 a 2 das hbiles para completar este proceso.  Los precios de los medicamentos varan con frecuencia dependiendo del lugar de dnde se surte la receta y alguna farmacias pueden ofrecer precios ms baratos.  El sitio web www.goodrx.com tiene cupones para medicamentos de diferentes farmacias. Los precios aqu no tienen en cuenta lo que podra costar con la ayuda del seguro (puede ser ms barato con su seguro), pero el sitio web puede darle el precio si  no utiliz ningn seguro.  - Puede imprimir el cupn correspondiente y llevarlo con su receta a la farmacia.  - Tambin puede pasar por nuestra oficina durante el horario de atencin regular y recoger una tarjeta de cupones de GoodRx.  - Si necesita que su receta se enve electrnicamente a una farmacia diferente, informe a nuestra oficina a travs de MyChart de [place] o por telfono llamando al [phone] y presione la opcin 4.  

## 2022-03-31 NOTE — Progress Notes (Signed)
Follow-Up Visit   Subjective  Shelley Davila is a 51 y.o. female who presents for the following: Post op/suture removal (L mid lat pretibial - pathology proven MMIS, margins free. Pt is here today for suture removal), Annual Exam (Hx MMIS - patient has two irregular skin lesions to be biopsied today ), and ISK vs wart  (R elbow - S/P LN2 pt is here today for recheck). The patient presents for Total-Body Skin Exam (TBSE) for skin cancer screening and mole check.  The patient has spots, moles and lesions to be evaluated, some may be new or changing.  The following portions of the chart were reviewed this encounter and updated as appropriate:   Tobacco  Allergies  Meds  Problems  Med Hx  Surg Hx  Fam Hx     Review of Systems:  No other skin or systemic complaints except as noted in HPI or Assessment and Plan.  Objective  Well appearing patient in no apparent distress; mood and affect are within normal limits.  A full examination was performed including scalp, head, eyes, ears, nose, lips, neck, chest, axillae, abdomen, back, buttocks, bilateral upper extremities, bilateral lower extremities, hands, feet, fingers, toes, fingernails, and toenails. All findings within normal limits unless otherwise noted below.  L mid lat pretibial Healing excision site.  R sup lat scapula 1.1 cm irregular brown papule.   L lat deloid 0.6 cm irregular brown macule.  RUQA 8.0 cm sup lat to umbilicus 0.6 cm irregular brown macule.  L med breast 0.6 cm irregular brown macule.   R dorsum wrist x 1 Erythematous stuck-on, waxy papule or plaque  R elbow x 1 Verrucous papules -- Discussed viral etiology and contagion.    Assessment & Plan  History of melanoma in situ L mid lat pretibial Encounter for Removal of Sutures - Incision site at the L mid lat pretibial is clean, dry and intact - Wound cleansed, sutures removed, wound cleansed and steri strips applied.  - Discussed pathology results  showing a margins free melanoma in situ  - Patient advised to keep steri-strips dry until they fall off. - Scars remodel for a full year. - Once steri-strips fall off, patient can apply over-the-counter silicone scar cream each night to help with scar remodeling if desired. - Patient advised to call with any concerns or if they notice any new or changing lesions.  Neoplasm of uncertain behavior of skin (4) R sup lat scapula Epidermal / dermal shaving  Lesion diameter (cm):  1.1 Informed consent: discussed and consent obtained   Timeout: patient name, date of birth, surgical site, and procedure verified   Procedure prep:  Patient was prepped and draped in usual sterile fashion Prep type:  Isopropyl alcohol Anesthesia: the lesion was anesthetized in a standard fashion   Anesthetic:  1% lidocaine w/ epinephrine 1-100,000 buffered w/ 8.4% NaHCO3 Instrument used: flexible razor blade   Hemostasis achieved with: pressure, aluminum chloride and electrodesiccation   Outcome: patient tolerated procedure well   Post-procedure details: sterile dressing applied and wound care instructions given   Dressing type: bandage and petrolatum    Specimen 1 - Surgical pathology Differential Diagnosis: D48.5 ISK r/o dysplasia Check Margins: No  L lat deloid Epidermal / dermal shaving  Lesion diameter (cm):  0.6 Informed consent: discussed and consent obtained   Timeout: patient name, date of birth, surgical site, and procedure verified   Procedure prep:  Patient was prepped and draped in usual sterile fashion Prep type:  Isopropyl alcohol Anesthesia: the lesion was anesthetized in a standard fashion   Anesthetic:  1% lidocaine w/ epinephrine 1-100,000 buffered w/ 8.4% NaHCO3 Instrument used: flexible razor blade   Hemostasis achieved with: pressure, aluminum chloride and electrodesiccation   Outcome: patient tolerated procedure well   Post-procedure details: sterile dressing applied and wound care  instructions given   Dressing type: bandage and petrolatum    Specimen 2 - Surgical pathology Differential Diagnosis: D48.5 r/o dysplastic nevus Check Margins: No  RUQA 8.0 cm sup lat to umbilicus Epidermal / dermal shaving  Lesion diameter (cm):  0.6 Informed consent: discussed and consent obtained   Timeout: patient name, date of birth, surgical site, and procedure verified   Procedure prep:  Patient was prepped and draped in usual sterile fashion Prep type:  Isopropyl alcohol Anesthesia: the lesion was anesthetized in a standard fashion   Anesthetic:  1% lidocaine w/ epinephrine 1-100,000 buffered w/ 8.4% NaHCO3 Instrument used: flexible razor blade   Hemostasis achieved with: pressure, aluminum chloride and electrodesiccation   Outcome: patient tolerated procedure well   Post-procedure details: sterile dressing applied and wound care instructions given   Dressing type: bandage and petrolatum    Specimen 3 - Surgical pathology Differential Diagnosis: D48.5 r/o dysplastic nevus Check Margins: No  L med breast Epidermal / dermal shaving  Lesion diameter (cm):  0.6 Informed consent: discussed and consent obtained   Timeout: patient name, date of birth, surgical site, and procedure verified   Procedure prep:  Patient was prepped and draped in usual sterile fashion Prep type:  Isopropyl alcohol Anesthesia: the lesion was anesthetized in a standard fashion   Anesthetic:  1% lidocaine w/ epinephrine 1-100,000 buffered w/ 8.4% NaHCO3 Instrument used: flexible razor blade   Hemostasis achieved with: pressure, aluminum chloride and electrodesiccation   Outcome: patient tolerated procedure well   Post-procedure details: sterile dressing applied and wound care instructions given   Dressing type: bandage and petrolatum    Specimen 4 - Surgical pathology Differential Diagnosis: D48.5 r/o dysplastic nevus Check Margins: No  Inflamed seborrheic keratosis R dorsum wrist x  1 Symptomatic, irritating, patient would like treated. Destruction of lesion - R dorsum wrist x 1 Complexity: simple   Destruction method: cryotherapy   Informed consent: discussed and consent obtained   Timeout:  patient name, date of birth, surgical site, and procedure verified Lesion destroyed using liquid nitrogen: Yes   Region frozen until ice ball extended beyond lesion: Yes   Outcome: patient tolerated procedure well with no complications   Post-procedure details: wound care instructions given    Viral warts vs ISK R elbow x 1  Destruction of lesion - R elbow x 1 Complexity: simple   Destruction method: cryotherapy   Informed consent: discussed and consent obtained   Timeout:  patient name, date of birth, surgical site, and procedure verified Lesion destroyed using liquid nitrogen: Yes   Region frozen until ice ball extended beyond lesion: Yes   Outcome: patient tolerated procedure well with no complications   Post-procedure details: wound care instructions given    Lentigines - Scattered tan macules - Due to sun exposure - Benign-appearing, observe - Recommend daily broad spectrum sunscreen SPF 30+ to sun-exposed areas, reapply every 2 hours as needed. - Call for any changes  Seborrheic Keratoses - Stuck-on, waxy, tan-brown papules and/or plaques  - Benign-appearing - Discussed benign etiology and prognosis. - Observe - Call for any changes  Melanocytic Nevi - Tan-brown and/or pink-flesh-colored symmetric macules and papules -  Benign appearing on exam today - Observation - Call clinic for new or changing moles - Recommend daily use of broad spectrum spf 30+ sunscreen to sun-exposed areas.   Hemangiomas - Red papules - Discussed benign nature - Observe - Call for any changes  Actinic Damage - Chronic condition, secondary to cumulative UV/sun exposure - diffuse scaly erythematous macules with underlying dyspigmentation - Recommend daily broad spectrum  sunscreen SPF 30+ to sun-exposed areas, reapply every 2 hours as needed.  - Staying in the shade or wearing long sleeves, sun glasses (UVA+UVB protection) and wide brim hats (4-inch brim around the entire circumference of the hat) are also recommended for sun protection.  - Call for new or changing lesions.  Dermatofibroma - L volar forearm  - Firm pink/brown papulenodule with dimple sign - Benign appearing - Call for any changes  Skin cancer screening performed today.  Return in about 3 months (around 07/01/2022) for TBSE.  Luther Redo, CMA, am acting as scribe for Sarina Ser, MD . Documentation: I have reviewed the above documentation for accuracy and completeness, and I agree with the above.  Sarina Ser, MD

## 2022-04-02 ENCOUNTER — Ambulatory Visit: Payer: No Typology Code available for payment source | Admitting: Dermatology

## 2022-04-02 ENCOUNTER — Encounter: Payer: Self-pay | Admitting: Dermatology

## 2022-04-06 ENCOUNTER — Telehealth: Payer: Self-pay

## 2022-05-05 ENCOUNTER — Other Ambulatory Visit: Payer: Self-pay | Admitting: Psychiatry

## 2022-05-05 ENCOUNTER — Other Ambulatory Visit: Payer: Self-pay | Admitting: Internal Medicine

## 2022-05-05 DIAGNOSIS — Z1231 Encounter for screening mammogram for malignant neoplasm of breast: Secondary | ICD-10-CM

## 2022-05-11 ENCOUNTER — Other Ambulatory Visit (HOSPITAL_COMMUNITY): Payer: Self-pay

## 2022-05-11 MED ORDER — OMEPRAZOLE 40 MG PO CPDR
DELAYED_RELEASE_CAPSULE | ORAL | 3 refills | Status: DC
  Filled 2022-05-11: qty 90, 90d supply, fill #0
  Filled 2022-08-13: qty 90, 90d supply, fill #1

## 2022-05-13 ENCOUNTER — Other Ambulatory Visit (HOSPITAL_COMMUNITY): Payer: Self-pay

## 2022-06-19 ENCOUNTER — Ambulatory Visit
Admission: RE | Admit: 2022-06-19 | Discharge: 2022-06-19 | Disposition: A | Discharge status: Home / Self Care | Payer: No Typology Code available for payment source | Source: Ambulatory Visit | Attending: Internal Medicine | Admitting: Internal Medicine

## 2022-06-19 ENCOUNTER — Other Ambulatory Visit: Payer: Self-pay | Admitting: Internal Medicine

## 2022-06-19 DIAGNOSIS — Z1231 Encounter for screening mammogram for malignant neoplasm of breast: Secondary | ICD-10-CM

## 2022-06-19 DIAGNOSIS — N6310 Unspecified lump in the right breast, unspecified quadrant: Secondary | ICD-10-CM

## 2022-06-24 ENCOUNTER — Ambulatory Visit: Payer: No Typology Code available for payment source | Admitting: Dermatology

## 2022-06-24 DIAGNOSIS — D492 Neoplasm of unspecified behavior of bone, soft tissue, and skin: Secondary | ICD-10-CM

## 2022-06-24 DIAGNOSIS — L814 Other melanin hyperpigmentation: Secondary | ICD-10-CM

## 2022-06-24 DIAGNOSIS — D225 Melanocytic nevi of trunk: Secondary | ICD-10-CM

## 2022-06-24 DIAGNOSIS — C4441 Basal cell carcinoma of skin of scalp and neck: Secondary | ICD-10-CM | POA: Diagnosis not present

## 2022-06-24 DIAGNOSIS — L578 Other skin changes due to chronic exposure to nonionizing radiation: Secondary | ICD-10-CM

## 2022-06-24 DIAGNOSIS — Z86006 Personal history of melanoma in-situ: Secondary | ICD-10-CM

## 2022-06-24 DIAGNOSIS — Z1283 Encounter for screening for malignant neoplasm of skin: Secondary | ICD-10-CM | POA: Diagnosis not present

## 2022-06-24 DIAGNOSIS — D1801 Hemangioma of skin and subcutaneous tissue: Secondary | ICD-10-CM

## 2022-06-24 DIAGNOSIS — D2362 Other benign neoplasm of skin of left upper limb, including shoulder: Secondary | ICD-10-CM | POA: Diagnosis not present

## 2022-06-24 DIAGNOSIS — I781 Nevus, non-neoplastic: Secondary | ICD-10-CM | POA: Diagnosis not present

## 2022-06-24 DIAGNOSIS — D239 Other benign neoplasm of skin, unspecified: Secondary | ICD-10-CM

## 2022-06-24 DIAGNOSIS — L821 Other seborrheic keratosis: Secondary | ICD-10-CM

## 2022-06-24 DIAGNOSIS — C4491 Basal cell carcinoma of skin, unspecified: Secondary | ICD-10-CM

## 2022-06-24 DIAGNOSIS — D229 Melanocytic nevi, unspecified: Secondary | ICD-10-CM

## 2022-06-24 HISTORY — DX: Basal cell carcinoma of skin, unspecified: C44.91

## 2022-06-24 NOTE — Progress Notes (Signed)
Follow-Up Visit   Subjective  Shelley Davila is a 51 y.o. female who presents for the following: Total body skin exam (Hx of Melanoma IS L mid lat pretibial, hx of Dysplastic Nevi) and check toenail (R great toenail, has a black line in it). The patient presents for Total-Body Skin Exam (TBSE) for skin cancer screening and mole check.  The patient has spots, moles and lesions to be evaluated, some may be new or changing and the patient has concerns that these could be cancer.   The following portions of the chart were reviewed this encounter and updated as appropriate:   Tobacco  Allergies  Meds  Problems  Med Hx  Surg Hx  Fam Hx     Review of Systems:  No other skin or systemic complaints except as noted in HPI or Assessment and Plan.  Objective  Well appearing patient in no apparent distress; mood and affect are within normal limits.  A full examination was performed including scalp, head, eyes, ears, nose, lips, neck, chest, axillae, abdomen, back, buttocks, bilateral upper extremities, bilateral lower extremities, hands, feet, fingers, toes, fingernails, and toenails. All findings within normal limits unless otherwise noted below.  L mid lat pretibial Well healed scar with no evidence of recurrence, no lymphadenopathy.  L neck inframandibular Pearly pap 1.5cm       L volar forearm Firm pink/brown papulenodule with dimple sign.   bil legs Telangiectasias bil legs   Assessment & Plan   Lentigines - Scattered tan macules - Due to sun exposure - Benign-appearing, observe - Recommend daily broad spectrum sunscreen SPF 30+ to sun-exposed areas, reapply every 2 hours as needed. - Call for any changes - upper back  Seborrheic Keratoses - Stuck-on, waxy, tan-brown papules and/or plaques  - Benign-appearing - Discussed benign etiology and prognosis. - Observe - Call for any changes - trunk  Melanocytic Nevi - Tan-brown and/or pink-flesh-colored symmetric  macules and papules - Benign appearing on exam today - Observation - Call clinic for new or changing moles - Recommend daily use of broad spectrum spf 30+ sunscreen to sun-exposed areas.  - trunk  Hemangiomas - Red papules - Discussed benign nature - Observe - Call for any changes - scalp, trunk  Actinic Damage - Chronic condition, secondary to cumulative UV/sun exposure - diffuse scaly erythematous macules with underlying dyspigmentation - Recommend daily broad spectrum sunscreen SPF 30+ to sun-exposed areas, reapply every 2 hours as needed.  - Staying in the shade or wearing long sleeves, sun glasses (UVA+UVB protection) and wide brim hats (4-inch brim around the entire circumference of the hat) are also recommended for sun protection.  - Call for new or changing lesions.  Skin cancer screening performed today.   History of Dysplastic Nevi - No evidence of recurrence today - Recommend regular full body skin exams - Recommend daily broad spectrum sunscreen SPF 30+ to sun-exposed areas, reapply every 2 hours as needed.  - Call if any new or changing lesions are noted between office visits  - L lat deltoid, RUQA 8.5UD lat to umbilicus, L medial breast  History of melanoma in situ L mid lat pretibial Lentigo Maligna Type Excised 03/24/2022 Clear.  No lymphadenopathy.  Observe for recurrence. Call clinic for new or changing lesions.  Recommend regular skin exams, daily broad-spectrum spf 30+ sunscreen use, and photoprotection.    Neoplasm of skin L neck inframandibular Epidermal / dermal shaving Lesion diameter (cm):  1.5 Informed consent: discussed and consent obtained  Timeout: patient name, date of birth, surgical site, and procedure verified   Procedure prep:  Patient was prepped and draped in usual sterile fashion Prep type:  Isopropyl alcohol Anesthesia: the lesion was anesthetized in a standard fashion   Anesthetic:  1% lidocaine w/ epinephrine 1-100,000 buffered w/  8.4% NaHCO3 Instrument used: flexible razor blade   Hemostasis achieved with: pressure, aluminum chloride and electrodesiccation   Outcome: patient tolerated procedure well   Post-procedure details: sterile dressing applied and wound care instructions given   Dressing type: bandage and bacitracin    Destruction of lesion Complexity: extensive   Destruction method: electrodesiccation and curettage   Informed consent: discussed and consent obtained   Timeout:  patient name, date of birth, surgical site, and procedure verified Procedure prep:  Patient was prepped and draped in usual sterile fashion Prep type:  Isopropyl alcohol Anesthesia: the lesion was anesthetized in a standard fashion   Anesthetic:  1% lidocaine w/ epinephrine 1-100,000 buffered w/ 8.4% NaHCO3 Curettage performed in three different directions: Yes   Electrodesiccation performed over the curetted area: Yes   Lesion length (cm):  1.5 Lesion width (cm):  1.5 Margin per side (cm):  0.2 Final wound size (cm):  1.9 Hemostasis achieved with:  pressure, aluminum chloride and electrodesiccation Outcome: patient tolerated procedure well with no complications   Post-procedure details: sterile dressing applied and wound care instructions given   Dressing type: bandage and bacitracin    Specimen 1 - Surgical pathology Differential Diagnosis: D48.5 R/O BCC Check Margins: No Pearly pap 1.5cm EDC  Dermatofibroma L volar forearm A dermatofibroma is a benign growth possibly related to trauma, such as an insect bite or inflamed acne-type bump.  Discussed removal (shave vrs excision) with resulting scar and risk of recurrence.  Since not bothersome, will observe for now.  Plan shave removal on f/u  Spider veins bil legs Benign, observe.    Return in about 3 months (around 09/23/2022) for TBSE, Hx of Melanoma IS, Hx of Dysplastic nevi.  I, Othelia Pulling, RMA, am acting as scribe for Sarina Ser, MD .  Documentation: I  have reviewed the above documentation for accuracy and completeness, and I agree with the above.  Sarina Ser, MD

## 2022-06-24 NOTE — Patient Instructions (Signed)
Wound Care Instructions  Cleanse wound gently with soap and water once a day then pat dry with clean gauze. Apply a thin coat of Petrolatum (petroleum jelly, "Vaseline") over the wound (unless you have an allergy to this). We recommend that you use a new, sterile tube of Vaseline. Do not pick or remove scabs. Do not remove the yellow or white "healing tissue" from the base of the wound.  Cover the wound with fresh, clean, nonstick gauze and secure with paper tape. You may use Band-Aids in place of gauze and tape if the wound is small enough, but would recommend trimming much of the tape off as there is often too much. Sometimes Band-Aids can irritate the skin.  You should call the office for your biopsy report after 1 week if you have not already been contacted.  If you experience any problems, such as abnormal amounts of bleeding, swelling, significant bruising, significant pain, or evidence of infection, please call the office immediately.  FOR ADULT SURGERY PATIENTS: If you need something for pain relief you may take 1 extra strength Tylenol (acetaminophen) AND 2 Ibuprofen (200mg each) together every 4 hours as needed for pain. (do not take these if you are allergic to them or if you have a reason you should not take them.) Typically, you may only need pain medication for 1 to 3 days.     Due to recent changes in healthcare laws, you may see results of your pathology and/or laboratory studies on MyChart before the doctors have had a chance to review them. We understand that in some cases there may be results that are confusing or concerning to you. Please understand that not all results are received at the same time and often the doctors may need to interpret multiple results in order to provide you with the best plan of care or course of treatment. Therefore, we ask that you please give us 2 business days to thoroughly review all your results before contacting the office for clarification. Should  we see a critical lab result, you will be contacted sooner.   If You Need Anything After Your Visit  If you have any questions or concerns for your doctor, please call our main line at [phone] and press option 4 to reach your doctor's medical assistant. If no one answers, please leave a voicemail as directed and we will return your call as soon as possible. Messages left after 4 pm will be answered the following business day.   You may also send us a message via MyChart. We typically respond to MyChart messages within 1-2 business days.  For prescription refills, please ask your pharmacy to contact our office. Our fax number is [phone].  If you have an urgent issue when the clinic is closed that cannot wait until the next business day, you can page your doctor at the number below.    Please note that while we do our best to be available for urgent issues outside of office hours, we are not available 24/7.   If you have an urgent issue and are unable to reach us, you may choose to seek medical care at your doctor's office, retail clinic, urgent care center, or emergency room.  If you have a medical emergency, please immediately call 911 or go to the emergency department.  Pager Numbers  - Dr. Kowalski: [phone]  - Dr. Moye: [phone]  - Dr. Stewart: [phone]  In the event of inclement weather, please call our main line at   [phone] for an update on the status of any delays or closures.  Dermatology Medication Tips: Please keep the boxes that topical medications come in in order to help keep track of the instructions about where and how to use these. Pharmacies typically print the medication instructions only on the boxes and not directly on the medication tubes.   If your medication is too expensive, please contact our office at [phone] option 4 or send us a message through MyChart.   We are unable to tell what your co-pay for medications will be in  advance as this is different depending on your insurance coverage. However, we may be able to find a substitute medication at lower cost or fill out paperwork to get insurance to cover a needed medication.   If a prior authorization is required to get your medication covered by your insurance company, please allow us 1-2 business days to complete this process.  Drug prices often vary depending on where the prescription is filled and some pharmacies may offer cheaper prices.  The website www.goodrx.com contains coupons for medications through different pharmacies. The prices here do not account for what the cost may be with help from insurance (it may be cheaper with your insurance), but the website can give you the price if you did not use any insurance.  - You can print the associated coupon and take it with your prescription to the pharmacy.  - You may also stop by our office during regular business hours and pick up a GoodRx coupon card.  - If you need your prescription sent electronically to a different pharmacy, notify our office through [facility] MyChart or by phone at [phone] option 4.     Si Usted Necesita Algo Despus de Su Visita  Tambin puede enviarnos un mensaje a travs de MyChart. Por lo general respondemos a los mensajes de MyChart en el transcurso de 1 a 2 das hbiles.  Para renovar recetas, por favor pida a su farmacia que se ponga en contacto con nuestra oficina. Nuestro nmero de fax es el [phone].  Si tiene un asunto urgente cuando la clnica est cerrada y que no puede esperar hasta el siguiente da hbil, puede llamar/localizar a su doctor(a) al nmero que aparece a continuacin.   Por favor, tenga en cuenta que aunque hacemos todo lo posible para estar disponibles para asuntos urgentes fuera del horario de oficina, no estamos disponibles las 24 horas del da, los 7 das de la semana.   Si tiene un problema urgente y no puede comunicarse con nosotros, puede  optar por buscar atencin mdica  en el consultorio de su doctor(a), en una clnica privada, en un centro de atencin urgente o en una sala de emergencias.  Si tiene una emergencia mdica, por favor llame inmediatamente al 911 o vaya a la sala de emergencias.  Nmeros de bper  - Dr. Kowalski: [phone]  - Dra. Moye: [phone]  - Dra. Stewart: [phone]  En caso de inclemencias del tiempo, por favor llame a nuestra lnea principal al [phone] para una actualizacin sobre el estado de cualquier retraso o cierre.  Consejos para la medicacin en dermatologa: Por favor, guarde las cajas en las que vienen los medicamentos de uso tpico para ayudarle a seguir las instrucciones sobre dnde y cmo usarlos. Las farmacias generalmente imprimen las instrucciones del medicamento slo en las cajas y no directamente en los tubos del medicamento.   Si su medicamento es muy caro, por favor, pngase en contacto con   nuestra oficina llamando al [phone] y presione la opcin 4 o envenos un mensaje a travs de MyChart.   No podemos decirle cul ser su copago por los medicamentos por adelantado ya que esto es diferente dependiendo de la cobertura de su seguro. Sin embargo, es posible que podamos encontrar un medicamento sustituto a menor costo o llenar un formulario para que el seguro cubra el medicamento que se considera necesario.   Si se requiere una autorizacin previa para que su compaa de seguros cubra su medicamento, por favor permtanos de 1 a 2 das hbiles para completar este proceso.  Los precios de los medicamentos varan con frecuencia dependiendo del lugar de dnde se surte la receta y alguna farmacias pueden ofrecer precios ms baratos.  El sitio web www.goodrx.com tiene cupones para medicamentos de diferentes farmacias. Los precios aqu no tienen en cuenta lo que podra costar con la ayuda del seguro (puede ser ms barato con su seguro), pero el sitio web puede darle el  precio si no utiliz ningn seguro.  - Puede imprimir el cupn correspondiente y llevarlo con su receta a la farmacia.  - Tambin puede pasar por nuestra oficina durante el horario de atencin regular y recoger una tarjeta de cupones de GoodRx.  - Si necesita que su receta se enve electrnicamente a una farmacia diferente, informe a nuestra oficina a travs de MyChart de  o por telfono llamando al [phone] y presione la opcin 4.  

## 2022-06-28 ENCOUNTER — Encounter: Payer: Self-pay | Admitting: Dermatology

## 2022-06-29 ENCOUNTER — Telehealth: Payer: Self-pay

## 2022-06-29 NOTE — Telephone Encounter (Signed)
Patient informed of pathology results 

## 2022-06-29 NOTE — Telephone Encounter (Signed)
-----   Message from Ralene Bathe, MD sent at 06/28/2022  1:08 PM EDT ----- Diagnosis Skin , left neck inframandibular BASAL CELL CARCINOMA, NODULAR PATTERN  Cancer - BCC Already treated Recheck next visit

## 2022-07-10 ENCOUNTER — Ambulatory Visit
Admission: RE | Admit: 2022-07-10 | Discharge: 2022-07-10 | Disposition: A | Discharge status: Home / Self Care | Payer: No Typology Code available for payment source | Source: Ambulatory Visit | Attending: Internal Medicine | Admitting: Internal Medicine

## 2022-07-10 DIAGNOSIS — N6310 Unspecified lump in the right breast, unspecified quadrant: Secondary | ICD-10-CM

## 2022-08-13 ENCOUNTER — Telehealth: Payer: Self-pay

## 2022-08-13 ENCOUNTER — Other Ambulatory Visit (HOSPITAL_COMMUNITY): Payer: Self-pay

## 2022-08-13 NOTE — Telephone Encounter (Signed)
Patient called regarding denial claims for 03/24/22 and 03/31/2022 visits. Patient states we are suppose to enter a precert through [employer].  Request completed with ID codes [id number] and [id number]. aw

## 2022-09-10 ENCOUNTER — Telehealth: Payer: Self-pay

## 2022-09-10 NOTE — Telephone Encounter (Signed)
Spoke with patient today, [facility] 03/24/22 still being denied. Precert done was only approved for DOS 03/31/22.  Pre certification re submitted with ID [id number]

## 2022-09-18 ENCOUNTER — Other Ambulatory Visit (HOSPITAL_COMMUNITY): Payer: Self-pay

## 2022-09-18 MED ORDER — OMEPRAZOLE 40 MG PO CPDR
40.0000 mg | DELAYED_RELEASE_CAPSULE | Freq: Every day | ORAL | 3 refills | Status: DC
  Filled 2022-09-18: qty 90, 90d supply, fill #0

## 2022-09-19 ENCOUNTER — Other Ambulatory Visit (HOSPITAL_COMMUNITY): Payer: Self-pay

## 2022-09-24 ENCOUNTER — Ambulatory Visit (INDEPENDENT_AMBULATORY_CARE_PROVIDER_SITE_OTHER): Payer: No Typology Code available for payment source | Admitting: Dermatology

## 2022-09-24 VITALS — BP 134/85 | HR 59

## 2022-09-24 DIAGNOSIS — D239 Other benign neoplasm of skin, unspecified: Secondary | ICD-10-CM

## 2022-09-24 DIAGNOSIS — Z86018 Personal history of other benign neoplasm: Secondary | ICD-10-CM

## 2022-09-24 DIAGNOSIS — Z1283 Encounter for screening for malignant neoplasm of skin: Secondary | ICD-10-CM | POA: Diagnosis not present

## 2022-09-24 DIAGNOSIS — Z85820 Personal history of malignant melanoma of skin: Secondary | ICD-10-CM

## 2022-09-24 DIAGNOSIS — Z86006 Personal history of melanoma in-situ: Secondary | ICD-10-CM

## 2022-09-24 DIAGNOSIS — L821 Other seborrheic keratosis: Secondary | ICD-10-CM

## 2022-09-24 DIAGNOSIS — D229 Melanocytic nevi, unspecified: Secondary | ICD-10-CM

## 2022-09-24 DIAGNOSIS — L814 Other melanin hyperpigmentation: Secondary | ICD-10-CM

## 2022-09-24 DIAGNOSIS — D2362 Other benign neoplasm of skin of left upper limb, including shoulder: Secondary | ICD-10-CM | POA: Diagnosis not present

## 2022-09-24 DIAGNOSIS — L578 Other skin changes due to chronic exposure to nonionizing radiation: Secondary | ICD-10-CM

## 2022-09-24 DIAGNOSIS — D2372 Other benign neoplasm of skin of left lower limb, including hip: Secondary | ICD-10-CM | POA: Diagnosis not present

## 2022-09-24 DIAGNOSIS — Z85828 Personal history of other malignant neoplasm of skin: Secondary | ICD-10-CM

## 2022-09-24 NOTE — Progress Notes (Signed)
Follow-Up Visit   Subjective  Shelley Davila is a 51 y.o. female who presents for the following: Other (History of Melanoma in situ, BCC, Dysplastic nevus - The patient presents for Total-Body Skin Exam (TBSE) for skin cancer screening and mole check.  The patient has spots, moles and lesions to be evaluated, some may be new or changing and the patient has concerns that these could be cancer./).  The following portions of the chart were reviewed this encounter and updated as appropriate:   Tobacco  Allergies  Meds  Problems  Med Hx  Surg Hx  Fam Hx     Review of Systems:  No other skin or systemic complaints except as noted in HPI or Assessment and Plan.  Objective  Well appearing patient in no apparent distress; mood and affect are within normal limits.  A full examination was performed including scalp, head, eyes, ears, nose, lips, neck, chest, axillae, abdomen, back, buttocks, bilateral upper extremities, bilateral lower extremities, hands, feet, fingers, toes, fingernails, and toenails. All findings within normal limits unless otherwise noted below.  Left calf, left forearm Firm pink/brown papulenodule with dimple sign.    Assessment & Plan   History of Basal Cell Carcinoma of the Skin - No evidence of recurrence today - Recommend regular full body skin exams - Recommend daily broad spectrum sunscreen SPF 30+ to sun-exposed areas, reapply every 2 hours as needed.  - Call if any new or changing lesions are noted between office visits  History of Dysplastic Nevi - No evidence of recurrence today - Recommend regular full body skin exams - Recommend daily broad spectrum sunscreen SPF 30+ to sun-exposed areas, reapply every 2 hours as needed.  - Call if any new or changing lesions are noted between office visits  Lentigines - Scattered tan macules - Due to sun exposure - Benign-appearing, observe - Recommend daily broad spectrum sunscreen SPF 30+ to sun-exposed areas,  reapply every 2 hours as needed. - Call for any changes  Seborrheic Keratoses - Stuck-on, waxy, tan-brown papules and/or plaques  - Benign-appearing - Discussed benign etiology and prognosis. - Observe - Call for any changes  Melanocytic Nevi - Tan-brown and/or pink-flesh-colored symmetric macules and papules - Benign appearing on exam today - Observation - Call clinic for new or changing moles - Recommend daily use of broad spectrum spf 30+ sunscreen to sun-exposed areas.   Hemangiomas - Red papules - Discussed benign nature - Observe - Call for any changes  Actinic Damage - Chronic condition, secondary to cumulative UV/sun exposure - diffuse scaly erythematous macules with underlying dyspigmentation - Recommend daily broad spectrum sunscreen SPF 30+ to sun-exposed areas, reapply every 2 hours as needed.  - Staying in the shade or wearing long sleeves, sun glasses (UVA+UVB protection) and wide brim hats (4-inch brim around the entire circumference of the hat) are also recommended for sun protection.  - Call for new or changing lesions.  Skin cancer screening performed today.  History of melanoma in situ Left mid lat pretibial  Lentigo Maligna Type Excised 03/24/2022 Clear.  No lymphadenopathy.  Observe for recurrence. Call clinic for new or changing lesions.  Recommend regular skin exams, daily broad-spectrum spf 30+ sunscreen use, and photoprotection.    Dermatofibroma Left calf, left forearm  Benign-appearing.  Observation.  Call clinic for new or changing lesions.  Recommend daily use of broad spectrum spf 30+ sunscreen to sun-exposed areas.     Return in about 3 months (around 12/24/2022) for TBSE History  of Melanoma in situ.  I, Ashok Cordia, CMA, am acting as scribe for Sarina Ser, MD . Documentation: I have reviewed the above documentation for accuracy and completeness, and I agree with the above.  Sarina Ser, MD

## 2022-09-24 NOTE — Patient Instructions (Signed)
Due to recent changes in healthcare laws, you may see results of your pathology and/or laboratory studies on MyChart before the doctors have had a chance to review them. We understand that in some cases there may be results that are confusing or concerning to you. Please understand that not all results are received at the same time and often the doctors may need to interpret multiple results in order to provide you with the best plan of care or course of treatment. Therefore, we ask that you please give us 2 business days to thoroughly review all your results before contacting the office for clarification. Should we see a critical lab result, you will be contacted sooner.   If You Need Anything After Your Visit  If you have any questions or concerns for your doctor, please call our main line at [phone] and press option 4 to reach your doctor's medical assistant. If no one answers, please leave a voicemail as directed and we will return your call as soon as possible. Messages left after 4 pm will be answered the following business day.   You may also send us a message via MyChart. We typically respond to MyChart messages within 1-2 business days.  For prescription refills, please ask your pharmacy to contact our office. Our fax number is [phone].  If you have an urgent issue when the clinic is closed that cannot wait until the next business day, you can page your doctor at the number below.    Please note that while we do our best to be available for urgent issues outside of office hours, we are not available 24/7.   If you have an urgent issue and are unable to reach us, you may choose to seek medical care at your doctor's office, retail clinic, urgent care center, or emergency room.  If you have a medical emergency, please immediately call 911 or go to the emergency department.  Pager Numbers  - Dr. Kowalski: [phone]  - Dr. Moye: [phone]  - Dr. Stewart:  [phone]  In the event of inclement weather, please call our main line at [phone] for an update on the status of any delays or closures.  Dermatology Medication Tips: Please keep the boxes that topical medications come in in order to help keep track of the instructions about where and how to use these. Pharmacies typically print the medication instructions only on the boxes and not directly on the medication tubes.   If your medication is too expensive, please contact our office at [phone] option 4 or send us a message through MyChart.   We are unable to tell what your co-pay for medications will be in advance as this is different depending on your insurance coverage. However, we may be able to find a substitute medication at lower cost or fill out paperwork to get insurance to cover a needed medication.   If a prior authorization is required to get your medication covered by your insurance company, please allow us 1-2 business days to complete this process.  Drug prices often vary depending on where the prescription is filled and some pharmacies may offer cheaper prices.  The website www.goodrx.com contains coupons for medications through different pharmacies. The prices here do not account for what the cost may be with help from insurance (it may be cheaper with your insurance), but the website can give you the price if you did not use any insurance.  - You can print the associated coupon and take it with   your prescription to the pharmacy.  - You may also stop by our office during regular business hours and pick up a GoodRx coupon card.  - If you need your prescription sent electronically to a different pharmacy, notify our office through [facility] MyChart or by phone at [phone] option 4.     Si Usted Necesita Algo Despus de Su Visita  Tambin puede enviarnos un mensaje a travs de MyChart. Por lo general respondemos a los mensajes de MyChart en el transcurso de 1 a 2  das hbiles.  Para renovar recetas, por favor pida a su farmacia que se ponga en contacto con nuestra oficina. Nuestro nmero de fax es el [phone].  Si tiene un asunto urgente cuando la clnica est cerrada y que no puede esperar hasta el siguiente da hbil, puede llamar/localizar a su doctor(a) al nmero que aparece a continuacin.   Por favor, tenga en cuenta que aunque hacemos todo lo posible para estar disponibles para asuntos urgentes fuera del horario de oficina, no estamos disponibles las 24 horas del da, los 7 das de la semana.   Si tiene un problema urgente y no puede comunicarse con nosotros, puede optar por buscar atencin mdica  en el consultorio de su doctor(a), en una clnica privada, en un centro de atencin urgente o en una sala de emergencias.  Si tiene una emergencia mdica, por favor llame inmediatamente al 911 o vaya a la sala de emergencias.  Nmeros de bper  - Dr. Kowalski: [phone]  - Dra. Moye: [phone]  - Dra. Stewart: [phone]  En caso de inclemencias del tiempo, por favor llame a nuestra lnea principal al [phone] para una actualizacin sobre el estado de cualquier retraso o cierre.  Consejos para la medicacin en dermatologa: Por favor, guarde las cajas en las que vienen los medicamentos de uso tpico para ayudarle a seguir las instrucciones sobre dnde y cmo usarlos. Las farmacias generalmente imprimen las instrucciones del medicamento slo en las cajas y no directamente en los tubos del medicamento.   Si su medicamento es muy caro, por favor, pngase en contacto con nuestra oficina llamando al [phone] y presione la opcin 4 o envenos un mensaje a travs de MyChart.   No podemos decirle cul ser su copago por los medicamentos por adelantado ya que esto es diferente dependiendo de la cobertura de su seguro. Sin embargo, es posible que podamos encontrar un medicamento sustituto a menor costo o llenar un formulario para que el  seguro cubra el medicamento que se considera necesario.   Si se requiere una autorizacin previa para que su compaa de seguros cubra su medicamento, por favor permtanos de 1 a 2 das hbiles para completar este proceso.  Los precios de los medicamentos varan con frecuencia dependiendo del lugar de dnde se surte la receta y alguna farmacias pueden ofrecer precios ms baratos.  El sitio web www.goodrx.com tiene cupones para medicamentos de diferentes farmacias. Los precios aqu no tienen en cuenta lo que podra costar con la ayuda del seguro (puede ser ms barato con su seguro), pero el sitio web puede darle el precio si no utiliz ningn seguro.  - Puede imprimir el cupn correspondiente y llevarlo con su receta a la farmacia.  - Tambin puede pasar por nuestra oficina durante el horario de atencin regular y recoger una tarjeta de cupones de GoodRx.  - Si necesita que su receta se enve electrnicamente a una farmacia diferente, informe a nuestra oficina a travs de MyChart de [facility]   o por telfono llamando al [phone] y presione la opcin 4.  

## 2022-10-09 ENCOUNTER — Encounter: Payer: Self-pay | Admitting: Dermatology

## 2022-12-16 ENCOUNTER — Encounter: Payer: 59 | Admitting: Nurse Practitioner

## 2022-12-16 NOTE — Progress Notes (Signed)
Shelley Davila,  Best practice is to get a urine culture (this is sent to the lab) I assume they cannot do that on a cruise ship. This tests for antibiotic resistance and identifies the bacteria present.   We recommend this with any recurrent UTI symptoms.   I would recommend calling your primary care, if you cannot get in an urgent care would be able to see you and send out a urine culture.   I feel your condition warrants further evaluation and I recommend that you be seen for a face to face visit.  Please contact your primary care physician practice to be seen. Many offices offer virtual options to be seen via video if you are not comfortable going in person to a medical facility at this time.  NOTE: You will NOT be charged for this eVisit.  If you do not have a PCP, [facility] offers a free physician referral service available at [phone]. Our trained staff has the experience, knowledge and resources to put you in touch with a physician who is right for you.    If you are having a true medical emergency please call 911.   Your e-visit answers were reviewed by a board certified advanced clinical practitioner to complete your personal care plan.  Thank you for using e-Visits.

## 2022-12-18 DIAGNOSIS — R300 Dysuria: Secondary | ICD-10-CM | POA: Diagnosis not present

## 2023-01-04 ENCOUNTER — Ambulatory Visit: Payer: 59 | Admitting: Obstetrics

## 2023-01-04 ENCOUNTER — Other Ambulatory Visit: Payer: Self-pay

## 2023-01-04 ENCOUNTER — Ambulatory Visit: Payer: 59 | Admitting: Dermatology

## 2023-01-04 ENCOUNTER — Encounter: Payer: Self-pay | Admitting: Obstetrics

## 2023-01-04 VITALS — BP 132/87 | HR 56

## 2023-01-04 VITALS — BP 141/75 | HR 54 | Resp 16 | Ht 66.75 in | Wt 136.0 lb

## 2023-01-04 DIAGNOSIS — L821 Other seborrheic keratosis: Secondary | ICD-10-CM

## 2023-01-04 DIAGNOSIS — L814 Other melanin hyperpigmentation: Secondary | ICD-10-CM

## 2023-01-04 DIAGNOSIS — N951 Menopausal and female climacteric states: Secondary | ICD-10-CM

## 2023-01-04 DIAGNOSIS — D2272 Melanocytic nevi of left lower limb, including hip: Secondary | ICD-10-CM | POA: Diagnosis not present

## 2023-01-04 DIAGNOSIS — Z86018 Personal history of other benign neoplasm: Secondary | ICD-10-CM

## 2023-01-04 DIAGNOSIS — Z85820 Personal history of malignant melanoma of skin: Secondary | ICD-10-CM | POA: Diagnosis not present

## 2023-01-04 DIAGNOSIS — N390 Urinary tract infection, site not specified: Secondary | ICD-10-CM

## 2023-01-04 DIAGNOSIS — Z87440 Personal history of urinary (tract) infections: Secondary | ICD-10-CM

## 2023-01-04 DIAGNOSIS — L570 Actinic keratosis: Secondary | ICD-10-CM | POA: Diagnosis not present

## 2023-01-04 DIAGNOSIS — Z85828 Personal history of other malignant neoplasm of skin: Secondary | ICD-10-CM | POA: Diagnosis not present

## 2023-01-04 DIAGNOSIS — D229 Melanocytic nevi, unspecified: Secondary | ICD-10-CM

## 2023-01-04 DIAGNOSIS — Z1283 Encounter for screening for malignant neoplasm of skin: Secondary | ICD-10-CM | POA: Diagnosis not present

## 2023-01-04 DIAGNOSIS — L578 Other skin changes due to chronic exposure to nonionizing radiation: Secondary | ICD-10-CM

## 2023-01-04 DIAGNOSIS — D2362 Other benign neoplasm of skin of left upper limb, including shoulder: Secondary | ICD-10-CM | POA: Diagnosis not present

## 2023-01-04 DIAGNOSIS — D485 Neoplasm of uncertain behavior of skin: Secondary | ICD-10-CM

## 2023-01-04 LAB — POCT URINALYSIS DIPSTICK
Bilirubin, UA: NEGATIVE
Blood, UA: NEGATIVE
Glucose, UA: NEGATIVE
Ketones, UA: NEGATIVE
Leukocytes, UA: NEGATIVE
Nitrite, UA: NEGATIVE
Protein, UA: NEGATIVE
Spec Grav, UA: 1.01 (ref 1.010–1.025)
Urobilinogen, UA: 0.2 E.U./dL
pH, UA: 6.5 (ref 5.0–8.0)

## 2023-01-04 MED ORDER — PHENAZOPYRIDINE HCL 200 MG PO TABS
200.0000 mg | ORAL_TABLET | Freq: Three times a day (TID) | ORAL | 2 refills | Status: DC | PRN
  Filled 2023-01-04: qty 10, 4d supply, fill #0

## 2023-01-04 MED ORDER — PHENAZOPYRIDINE HCL 200 MG PO TABS
200.0000 mg | ORAL_TABLET | Freq: Three times a day (TID) | ORAL | 2 refills | Status: DC | PRN
  Filled 2023-01-04 – 2023-01-07 (×2): qty 10, 4d supply, fill #0

## 2023-01-04 MED ORDER — LEVONORGEST-ETH ESTRAD 91-DAY 0.15-0.03 &0.01 MG PO TABS
1.0000 | ORAL_TABLET | Freq: Every day | ORAL | 4 refills | Status: DC
  Filled 2023-01-04 – 2023-01-07 (×2): qty 91, 91d supply, fill #0
  Filled 2023-03-15 – 2023-03-23 (×3): qty 91, 91d supply, fill #1
  Filled 2023-07-03: qty 91, 91d supply, fill #2
  Filled 2023-09-30: qty 91, 91d supply, fill #3
  Filled 2023-12-08 – 2023-12-29 (×2): qty 91, 91d supply, fill #4

## 2023-01-04 NOTE — Progress Notes (Signed)
    GYNECOLOGY PROGRESS NOTE  Subjective:    Patient ID: Shelley Davila, female    DOB: 09/28/1971, 52 y.o.   MRN: [id number]  HPI  Patient is a 52 y.o. [id number] female who presents for evaluation of recurrent UTI and hot flashes. She has been having hot flashes on and off for quit a while. She said when she starts her menstrual cycle the hot flashes resolve, as soon her cycle goes off they start back. She has night sweats, that make it difficult to sleep at night. She has had two episodes of urinary tract infection back to back. She denies having any symptoms right now. She just completed a course of cipro. She had a uti on February 16 and then again on March 8. She was treated with macrodantin and symptoms resolved and in March she was treated with cipro.  The following portions of the patient's history were reviewed and updated as appropriate: allergies, current medications, past family history, past medical history, past social history, past surgical history, and problem list.  Review of Systems Pertinent items are noted in HPI.   Objective:   Resp. rate 16, height 5' 6.75" (1.695 m), weight 136 lb (61.7 kg), last menstrual period 09/21/2022. Body mass index is 21.46 kg/m. General appearance: alert, cooperative, and no distress Abdomen: soft, non-tender; bowel sounds normal; no masses,  no organomegaly Pelvic: cervix normal in appearance, external genitalia normal, no adnexal masses or tenderness, no cervical motion tenderness, rectovaginal septum normal, uterus normal size, shape, and consistency, and vagina normal without discharge Extremities: extremities normal, atraumatic, no cyanosis or edema Neurologic: Grossly normal   Assessment:   1. Perimenopausal   2. Recurrent UTI      Plan:   1. Perimenopausal Struggling with hot flashes daily and especially at night. Irregular periods   2. Recurrent UTI She will start on daily cranberry extract. If need may trial  suppressive  therapy , but she will contact us is she wants to proceed with daily Macrobid.

## 2023-01-04 NOTE — Progress Notes (Unsigned)
Follow-Up Visit   Subjective  Shelley Davila is a 52 y.o. female who presents for the following: Skin Cancer Screening and Full Body Skin Exam - Hx MMIS, dysplastic nevi, and BCC The patient presents for Total-Body Skin Exam (TBSE) for skin cancer screening and mole check. The patient has spots, moles and lesions to be evaluated, some may be new or changing and the patient has concerns that these could be cancer.  The following portions of the chart were reviewed this encounter and updated as appropriate: medications, allergies, medical history  Review of Systems:  No other skin or systemic complaints except as noted in HPI or Assessment and Plan.  Objective  Well appearing patient in no apparent distress; mood and affect are within normal limits.  A full examination was performed including scalp, head, eyes, ears, nose, lips, neck, chest, axillae, abdomen, back, buttocks, bilateral upper extremities, bilateral lower extremities, hands, feet, fingers, toes, fingernails, and toenails. All findings within normal limits unless otherwise noted below.    L volar forearm 0.6 cm irregular brown macule  L middle medial thigh 0.6 cm irregular brown macule   Assessment & Plan   Neoplasm of uncertain behavior of skin (2) L volar forearm  Epidermal / dermal shaving  Lesion diameter (cm):  0.6 Informed consent: discussed and consent obtained   Timeout: patient name, date of birth, surgical site, and procedure verified   Procedure prep:  Patient was prepped and draped in usual sterile fashion Prep type:  Isopropyl alcohol Anesthesia: the lesion was anesthetized in a standard fashion   Anesthetic:  1% lidocaine w/ epinephrine 1-100,000 buffered w/ 8.4% NaHCO3 Instrument used: flexible razor blade   Hemostasis achieved with: pressure, aluminum chloride and electrodesiccation   Outcome: patient tolerated procedure well   Post-procedure details: sterile dressing applied and wound care  instructions given   Dressing type: bandage and petrolatum    Specimen 1 - Surgical pathology Differential Diagnosis: D48.5 irritated nevus r/o dysplasia  Check Margins: No  L middle medial thigh  Epidermal / dermal shaving  Lesion diameter (cm):  0.6 Informed consent: discussed and consent obtained   Timeout: patient name, date of birth, surgical site, and procedure verified   Procedure prep:  Patient was prepped and draped in usual sterile fashion Prep type:  Isopropyl alcohol Anesthesia: the lesion was anesthetized in a standard fashion   Anesthetic:  1% lidocaine w/ epinephrine 1-100,000 buffered w/ 8.4% NaHCO3 Instrument used: flexible razor blade   Hemostasis achieved with: pressure, aluminum chloride and electrodesiccation   Outcome: patient tolerated procedure well   Post-procedure details: sterile dressing applied and wound care instructions given   Dressing type: bandage and petrolatum    Specimen 2 - Surgical pathology Differential Diagnosis: D48.5 r/o dysplastic nevus  Check Margins: No  Lentigines, Seborrheic Keratoses, Hemangiomas - Benign normal skin lesions - Benign-appearing - Call for any changes  Melanocytic Nevi - Tan-brown and/or pink-flesh-colored symmetric macules and papules - Benign appearing on exam today - Observation - Call clinic for new or changing moles - Recommend daily use of broad spectrum spf 30+ sunscreen to sun-exposed areas.   Actinic Damage - Chronic condition, secondary to cumulative UV/sun exposure - diffuse scaly erythematous macules with underlying dyspigmentation - Recommend daily broad spectrum sunscreen SPF 30+ to sun-exposed areas, reapply every 2 hours as needed.  - Staying in the shade or wearing long sleeves, sun glasses (UVA+UVB protection) and wide brim hats (4-inch brim around the entire circumference of the hat) are  also recommended for sun protection.  - Call for new or changing lesions.  History of Melanoma in  Situ - No evidence of recurrence today - Recommend regular full body skin exams - Recommend daily broad spectrum sunscreen SPF 30+ to sun-exposed areas, reapply every 2 hours as needed.  - Call if any new or changing lesions are noted between office visits  History of Basal Cell Carcinoma of the Skin - No evidence of recurrence today - Recommend regular full body skin exams - Recommend daily broad spectrum sunscreen SPF 30+ to sun-exposed areas, reapply every 2 hours as needed.  - Call if any new or changing lesions are noted between office visits  History of Dysplastic Nevi - No evidence of recurrence today - Recommend regular full body skin exams - Recommend daily broad spectrum sunscreen SPF 30+ to sun-exposed areas, reapply every 2 hours as needed.  - Call if any new or changing lesions are noted between office visits  ACTINIC KERATOSIS  Actinic keratoses are precancerous spots that appear secondary to cumulative UV radiation exposure/sun exposure over time. They are chronic with expected duration over 1 year. A portion of actinic keratoses will progress to squamous cell carcinoma of the skin. It is not possible to reliably predict which spots will progress to skin cancer and so treatment is recommended to prevent development of skin cancer.  Recommend daily broad spectrum sunscreen SPF 30+ to sun-exposed areas, reapply every 2 hours as needed.  Recommend staying in the shade or wearing long sleeves, sun glasses (UVA+UVB protection) and wide brim hats (4-inch brim around the entire circumference of the hat). Call for new or changing lesions.  Prior to procedure, discussed risks of blister formation, small wound, skin dyspigmentation, or rare scar following cryotherapy. Recommend Vaseline ointment to treated areas while healing.  Destruction Procedure Note Destruction method: cryotherapy   Informed consent: discussed and consent obtained   Lesion destroyed using liquid nitrogen: Yes    Outcome: patient tolerated procedure well with no complications   Post-procedure details: wound care instructions given   Locations: L neck x 1 # of Lesions Treated: 1   Sebaceous Hyperplasia - Small yellow papules with a central dell - Benign-appearing - Observe. Call for changes.  Varicose Veins/Spider Veins - Dilated blue, purple or red veins at the lower extremities - Reassured - Smaller vessels can be treated by sclerotherapy (a procedure to inject a medicine into the veins to make them disappear) if desired, but the treatment is not covered by insurance. Larger vessels may be covered if symptomatic and we would refer to vascular surgeon if treatment desired.  Skin cancer screening performed today.  Return in about 4 months (around 05/06/2023) for TBSE.  Luther Redo, CMA, am acting as scribe for Sarina Ser, MD .  Documentation: I have reviewed the above documentation for accuracy and completeness, and I agree with the above.  Sarina Ser, MD

## 2023-01-04 NOTE — Patient Instructions (Addendum)
Wound Care Instructions  Cleanse wound gently with soap and water once a day then pat dry with clean gauze. Apply a thin coat of Petrolatum (petroleum jelly, "Vaseline") over the wound (unless you have an allergy to this). We recommend that you use a new, sterile tube of Vaseline. Do not pick or remove scabs. Do not remove the yellow or white "healing tissue" from the base of the wound.  Cover the wound with fresh, clean, nonstick gauze and secure with paper tape. You may use Band-Aids in place of gauze and tape if the wound is small enough, but would recommend trimming much of the tape off as there is often too much. Sometimes Band-Aids can irritate the skin.  You should call the office for your biopsy report after 1 week if you have not already been contacted.  If you experience any problems, such as abnormal amounts of bleeding, swelling, significant bruising, significant pain, or evidence of infection, please call the office immediately.  FOR ADULT SURGERY PATIENTS: If you need something for pain relief you may take 1 extra strength Tylenol (acetaminophen) AND 2 Ibuprofen (200mg each) together every 4 hours as needed for pain. (do not take these if you are allergic to them or if you have a reason you should not take them.) Typically, you may only need pain medication for 1 to 3 days.     Due to recent changes in healthcare laws, you may see results of your pathology and/or laboratory studies on MyChart before the doctors have had a chance to review them. We understand that in some cases there may be results that are confusing or concerning to you. Please understand that not all results are received at the same time and often the doctors may need to interpret multiple results in order to provide you with the best plan of care or course of treatment. Therefore, we ask that you please give us 2 business days to thoroughly review all your results before contacting the office for clarification. Should  we see a critical lab result, you will be contacted sooner.   If You Need Anything After Your Visit  If you have any questions or concerns for your doctor, please call our main line at [phone] and press option 4 to reach your doctor's medical assistant. If no one answers, please leave a voicemail as directed and we will return your call as soon as possible. Messages left after 4 pm will be answered the following business day.   You may also send us a message via MyChart. We typically respond to MyChart messages within 1-2 business days.  For prescription refills, please ask your pharmacy to contact our office. Our fax number is [phone].  If you have an urgent issue when the clinic is closed that cannot wait until the next business day, you can page your doctor at the number below.    Please note that while we do our best to be available for urgent issues outside of office hours, we are not available 24/7.   If you have an urgent issue and are unable to reach us, you may choose to seek medical care at your doctor's office, retail clinic, urgent care center, or emergency room.  If you have a medical emergency, please immediately call 911 or go to the emergency department.  Pager Numbers  - Dr. Kowalski: [phone]  - Dr. Moye: [phone]  - Dr. Stewart: [phone]  In the event of inclement weather, please call our main line at   [phone] for an update on the status of any delays or closures.  Dermatology Medication Tips: Please keep the boxes that topical medications come in in order to help keep track of the instructions about where and how to use these. Pharmacies typically print the medication instructions only on the boxes and not directly on the medication tubes.   If your medication is too expensive, please contact our office at [phone] option 4 or send us a message through MyChart.   We are unable to tell what your co-pay for medications will be in  advance as this is different depending on your insurance coverage. However, we may be able to find a substitute medication at lower cost or fill out paperwork to get insurance to cover a needed medication.   If a prior authorization is required to get your medication covered by your insurance company, please allow us 1-2 business days to complete this process.  Drug prices often vary depending on where the prescription is filled and some pharmacies may offer cheaper prices.  The website www.goodrx.com contains coupons for medications through different pharmacies. The prices here do not account for what the cost may be with help from insurance (it may be cheaper with your insurance), but the website can give you the price if you did not use any insurance.  - You can print the associated coupon and take it with your prescription to the pharmacy.  - You may also stop by our office during regular business hours and pick up a GoodRx coupon card.  - If you need your prescription sent electronically to a different pharmacy, notify our office through [facility] MyChart or by phone at [phone] option 4.     Si Usted Necesita Algo Despus de Su Visita  Tambin puede enviarnos un mensaje a travs de MyChart. Por lo general respondemos a los mensajes de MyChart en el transcurso de 1 a 2 das hbiles.  Para renovar recetas, por favor pida a su farmacia que se ponga en contacto con nuestra oficina. Nuestro nmero de fax es el [phone].  Si tiene un asunto urgente cuando la clnica est cerrada y que no puede esperar hasta el siguiente da hbil, puede llamar/localizar a su doctor(a) al nmero que aparece a continuacin.   Por favor, tenga en cuenta que aunque hacemos todo lo posible para estar disponibles para asuntos urgentes fuera del horario de oficina, no estamos disponibles las 24 horas del da, los 7 das de la semana.   Si tiene un problema urgente y no puede comunicarse con nosotros, puede  optar por buscar atencin mdica  en el consultorio de su doctor(a), en una clnica privada, en un centro de atencin urgente o en una sala de emergencias.  Si tiene una emergencia mdica, por favor llame inmediatamente al 911 o vaya a la sala de emergencias.  Nmeros de bper  - Dr. Kowalski: [phone]  - Dra. Moye: [phone]  - Dra. Stewart: [phone]  En caso de inclemencias del tiempo, por favor llame a nuestra lnea principal al [phone] para una actualizacin sobre el estado de cualquier retraso o cierre.  Consejos para la medicacin en dermatologa: Por favor, guarde las cajas en las que vienen los medicamentos de uso tpico para ayudarle a seguir las instrucciones sobre dnde y cmo usarlos. Las farmacias generalmente imprimen las instrucciones del medicamento slo en las cajas y no directamente en los tubos del medicamento.   Si su medicamento es muy caro, por favor, pngase en contacto con   nuestra oficina llamando al [phone] y presione la opcin 4 o envenos un mensaje a travs de MyChart.   No podemos decirle cul ser su copago por los medicamentos por adelantado ya que esto es diferente dependiendo de la cobertura de su seguro. Sin embargo, es posible que podamos encontrar un medicamento sustituto a menor costo o llenar un formulario para que el seguro cubra el medicamento que se considera necesario.   Si se requiere una autorizacin previa para que su compaa de seguros cubra su medicamento, por favor permtanos de 1 a 2 das hbiles para completar este proceso.  Los precios de los medicamentos varan con frecuencia dependiendo del lugar de dnde se surte la receta y alguna farmacias pueden ofrecer precios ms baratos.  El sitio web www.goodrx.com tiene cupones para medicamentos de diferentes farmacias. Los precios aqu no tienen en cuenta lo que podra costar con la ayuda del seguro (puede ser ms barato con su seguro), pero el sitio web puede darle el  precio si no utiliz ningn seguro.  - Puede imprimir el cupn correspondiente y llevarlo con su receta a la farmacia.  - Tambin puede pasar por nuestra oficina durante el horario de atencin regular y recoger una tarjeta de cupones de GoodRx.  - Si necesita que su receta se enve electrnicamente a una farmacia diferente, informe a nuestra oficina a travs de MyChart de [facility] o por telfono llamando al [phone] y presione la opcin 4.  

## 2023-01-05 ENCOUNTER — Other Ambulatory Visit (HOSPITAL_COMMUNITY): Payer: Self-pay

## 2023-01-05 ENCOUNTER — Other Ambulatory Visit: Payer: Self-pay

## 2023-01-05 ENCOUNTER — Encounter: Payer: Self-pay | Admitting: Dermatology

## 2023-01-07 ENCOUNTER — Telehealth: Payer: Self-pay

## 2023-01-07 ENCOUNTER — Other Ambulatory Visit: Payer: Self-pay

## 2023-01-07 NOTE — Telephone Encounter (Signed)
Discussed pathology results with patient and surgery scheduled.

## 2023-01-07 NOTE — Telephone Encounter (Signed)
Patient has the [place] and having surgery with you on 04/23. Can you please provide me with the procedure codes that will be used so I can get a pre cert done for her surgery visit?  Thank you

## 2023-01-07 NOTE — Telephone Encounter (Signed)
-----   Message from Ralene Bathe, MD sent at 01/06/2023  5:44 PM EDT ----- Diagnosis 1. Skin , left volar forearm CELLULAR DERMATOFIBROMA, BASE INVOLVED 2. Skin , left middle medial thigh DYSPLASTIC COMPOUND NEVUS WITH SEVERE ATYPIA, PERIPHERAL MARGIN INVOLVED, SEE DESCRIPTION  1- benign cellular dermatofibroma May recur No treatment necessary, but may consider excision if recurs. 2- Severe dysplastic Schedule surgery

## 2023-01-08 ENCOUNTER — Other Ambulatory Visit (HOSPITAL_COMMUNITY): Payer: Self-pay

## 2023-01-15 NOTE — Telephone Encounter (Signed)
Copy of authorization request placed in media. aw

## 2023-02-02 ENCOUNTER — Telehealth: Payer: Self-pay

## 2023-02-02 ENCOUNTER — Encounter: Payer: 59 | Admitting: Dermatology

## 2023-02-02 ENCOUNTER — Ambulatory Visit (INDEPENDENT_AMBULATORY_CARE_PROVIDER_SITE_OTHER): Payer: 59 | Admitting: Dermatology

## 2023-02-02 ENCOUNTER — Encounter: Payer: Self-pay | Admitting: Dermatology

## 2023-02-02 ENCOUNTER — Other Ambulatory Visit: Payer: Self-pay

## 2023-02-02 VITALS — BP 128/77

## 2023-02-02 DIAGNOSIS — D2272 Melanocytic nevi of left lower limb, including hip: Secondary | ICD-10-CM

## 2023-02-02 DIAGNOSIS — D2221 Melanocytic nevi of right ear and external auricular canal: Secondary | ICD-10-CM

## 2023-02-02 DIAGNOSIS — D485 Neoplasm of uncertain behavior of skin: Secondary | ICD-10-CM

## 2023-02-02 MED ORDER — DOXYCYCLINE HYCLATE 100 MG PO TABS
100.0000 mg | ORAL_TABLET | Freq: Every day | ORAL | 0 refills | Status: DC
  Filled 2023-02-02: qty 7, 7d supply, fill #0

## 2023-02-02 NOTE — Progress Notes (Signed)
Follow-Up Visit   Subjective  Shelley Davila is a 52 y.o. female who presents for the following: Severe dysplastic nevus bx proven (L middle medial thigh, pt presents for excision) and check spot (R ear, pt just noticed).  The following portions of the chart were reviewed this encounter and updated as appropriate:   Tobacco  Allergies  Meds  Problems  Med Hx  Surg Hx  Fam Hx     Review of Systems:  No other skin or systemic complaints except as noted in HPI or Assessment and Plan.  Objective  Well appearing patient in no apparent distress; mood and affect are within normal limits.  A focused examination was performed including left thigh. Relevant physical exam findings are noted in the Assessment and Plan.  Left middle medial thigh pink biopsy site 1.0 x 1.1cm   Assessment & Plan   MELANOCYTIC NEVI Right ear Exam: Tan-brown and/or pink-flesh-colored symmetric macules and papules Treatment Plan: Benign appearing on exam today. Recommend observation. Call clinic for new or changing moles. Recommend daily use of broad spectrum spf 30+ sunscreen to sun-exposed areas.    Neoplasm of uncertain behavior of skin Left middle medial thigh  Skin excision  Lesion length (cm):  1 Lesion width (cm):  1.1 Margin per side (cm):  0.2 Total excision diameter (cm):  1.5 Informed consent: discussed and consent obtained   Timeout: patient name, date of birth, surgical site, and procedure verified   Procedure prep:  Patient was prepped and draped in usual sterile fashion Prep type:  Isopropyl alcohol and povidone-iodine Anesthesia: the lesion was anesthetized in a standard fashion   Anesthetic:  1% lidocaine w/ epinephrine 1-100,000 buffered w/ 8.4% NaHCO3 (9cc lido w/ epi, 6cc bupivicaine, Total 15cc) Instrument used: #15 blade   Hemostasis achieved with: pressure   Hemostasis achieved with comment:  Electrocautery Outcome: patient tolerated procedure well with no complications    Post-procedure details: sterile dressing applied and wound care instructions given   Dressing type: bandage, pressure dressing and bacitracin (mupirocin)    Skin repair Complexity:  Complex Final length (cm):  5 Reason for type of repair: reduce tension to allow closure, reduce the risk of dehiscence, infection, and necrosis, reduce subcutaneous dead space and avoid a hematoma, allow closure of the large defect, preserve normal anatomy, preserve normal anatomical and functional relationships and enhance both functionality and cosmetic results   Undermining: area extensively undermined   Undermining comment:  Undermining defect 1.5cm Subcutaneous layers (deep stitches):  Suture size:  3-0 Suture type: Vicryl (polyglactin 910)   Subcutaneous suture technique: inverted dermal. Fine/surface layer approximation (top stitches):  Suture size:  3-0 Suture type: nylon   Stitches: simple running   Suture removal (days):  7 Hemostasis achieved with: suture and pressure Outcome: patient tolerated procedure well with no complications   Post-procedure details: sterile dressing applied and wound care instructions given   Dressing type: bandage, pressure dressing and bacitracin (mupirocin)    doxycycline (VIBRA-TABS) 100 MG tablet Take 1 tablet (100 mg total) by mouth daily. With food and plenty of fluid  Specimen 1 - Surgical pathology Differential Diagnosis: Biopsy proven severe dysplastic nevus Check Margins: Yes [id number]  Severe dysplastic nevus, bx proven, excised today Start Mupirocin oint qd to excision site Start Doxycycline 100mg  1 po qd with food and drink  Doxycycline should be taken with food to prevent nausea. Do not lay down for 30 minutes after taking. Be cautious with sun exposure and use good sun  protection while on this medication. Pregnant women should not take this medication.     Return in about 1 week (around 02/09/2023) for suture removal.  I, Ardis Rowan, RMA, am  acting as scribe for Armida Sans, MD . Documentation: I have reviewed the above documentation for accuracy and completeness, and I agree with the above.  Armida Sans, MD

## 2023-02-02 NOTE — Patient Instructions (Signed)

## 2023-02-02 NOTE — Telephone Encounter (Signed)
Left pt msg to call if any problems after today's surgery./sh °

## 2023-02-03 ENCOUNTER — Ambulatory Visit: Payer: 59 | Admitting: Dermatology

## 2023-02-09 ENCOUNTER — Encounter: Payer: Self-pay | Admitting: Dermatology

## 2023-02-09 ENCOUNTER — Ambulatory Visit (INDEPENDENT_AMBULATORY_CARE_PROVIDER_SITE_OTHER): Payer: 59 | Admitting: Dermatology

## 2023-02-09 DIAGNOSIS — Z4802 Encounter for removal of sutures: Secondary | ICD-10-CM

## 2023-02-09 DIAGNOSIS — D235 Other benign neoplasm of skin of trunk: Secondary | ICD-10-CM

## 2023-02-09 DIAGNOSIS — D239 Other benign neoplasm of skin, unspecified: Secondary | ICD-10-CM

## 2023-02-09 NOTE — Progress Notes (Signed)
   Follow-Up Visit   Subjective  Shelley Davila is a 52 y.o. female who presents for the following: Suture removal, L middle medial thigh. Re-pigmented moderately dysplastic nevus at the RUQA 8.0 cm sup lat to umbilicus, patient would like area checked today.  Pathology showed a margins free severely dysplastic nevus   The following portions of the chart were reviewed this encounter and updated as appropriate: medications, allergies, medical history  Review of Systems:  No other skin or systemic complaints except as noted in HPI or Assessment and Plan.  Objective  Well appearing patient in no apparent distress; mood and affect are within normal limits.  Areas Examined: The L thigh and abdomen  Relevant physical exam findings are noted in the Assessment and Plan.  RLQA 8.0 cm sup lat to umbilicus Re-pigmented brown macule.     Assessment & Plan   Dysplastic nevus RLQA 8.0 cm sup lat to umbilicus  Plan shave removal at follow up appointment for TBSE   Encounter for Removal of Sutures - Incision site is clean, dry and intact. - Wound cleansed, sutures removed, wound cleansed and steri strips applied.  - Discussed pathology results showing a margins free severely dysplastic nevus - Patient advised to keep steri-strips dry until they fall off. - Scars remodel for a full year. - Once steri-strips fall off, patient can apply over-the-counter silicone scar cream once to twice a day to help with scar remodeling if desired. - Patient advised to call with any concerns or if they notice any new or changing lesions.   Return for appointment as scheduled for TBSE and shave removal for repigmented dysplastic nevus.  Maylene Roes, CMA, am acting as scribe for Armida Sans, MD .  Documentation: I have reviewed the above documentation for accuracy and completeness, and I agree with the above.  Armida Sans, MD

## 2023-02-09 NOTE — Patient Instructions (Signed)
Due to recent changes in healthcare laws, you may see results of your pathology and/or laboratory studies on MyChart before the doctors have had a chance to review them. We understand that in some cases there may be results that are confusing or concerning to you. Please understand that not all results are received at the same time and often the doctors may need to interpret multiple results in order to provide you with the best plan of care or course of treatment. Therefore, we ask that you please give us 2 business days to thoroughly review all your results before contacting the office for clarification. Should we see a critical lab result, you will be contacted sooner.   If You Need Anything After Your Visit  If you have any questions or concerns for your doctor, please call our main line at [phone] and press option 4 to reach your doctor's medical assistant. If no one answers, please leave a voicemail as directed and we will return your call as soon as possible. Messages left after 4 pm will be answered the following business day.   You may also send us a message via MyChart. We typically respond to MyChart messages within 1-2 business days.  For prescription refills, please ask your pharmacy to contact our office. Our fax number is [phone].  If you have an urgent issue when the clinic is closed that cannot wait until the next business day, you can page your doctor at the number below.    Please note that while we do our best to be available for urgent issues outside of office hours, we are not available 24/7.   If you have an urgent issue and are unable to reach us, you may choose to seek medical care at your doctor's office, retail clinic, urgent care center, or emergency room.  If you have a medical emergency, please immediately call 911 or go to the emergency department.  Pager Numbers  - Dr. Kowalski: [phone]  - Dr. Moye: [phone]  - Dr. Stewart:  [phone]  In the event of inclement weather, please call our main line at [phone] for an update on the status of any delays or closures.  Dermatology Medication Tips: Please keep the boxes that topical medications come in in order to help keep track of the instructions about where and how to use these. Pharmacies typically print the medication instructions only on the boxes and not directly on the medication tubes.   If your medication is too expensive, please contact our office at [phone] option 4 or send us a message through MyChart.   We are unable to tell what your co-pay for medications will be in advance as this is different depending on your insurance coverage. However, we may be able to find a substitute medication at lower cost or fill out paperwork to get insurance to cover a needed medication.   If a prior authorization is required to get your medication covered by your insurance company, please allow us 1-2 business days to complete this process.  Drug prices often vary depending on where the prescription is filled and some pharmacies may offer cheaper prices.  The website www.goodrx.com contains coupons for medications through different pharmacies. The prices here do not account for what the cost may be with help from insurance (it may be cheaper with your insurance), but the website can give you the price if you did not use any insurance.  - You can print the associated coupon and take it with   your prescription to the pharmacy.  - You may also stop by our office during regular business hours and pick up a GoodRx coupon card.  - If you need your prescription sent electronically to a different pharmacy, notify our office through [facility] MyChart or by phone at [phone] option 4.     Si Usted Necesita Algo Despus de Su Visita  Tambin puede enviarnos un mensaje a travs de MyChart. Por lo general respondemos a los mensajes de MyChart en el transcurso de 1 a 2  das hbiles.  Para renovar recetas, por favor pida a su farmacia que se ponga en contacto con nuestra oficina. Nuestro nmero de fax es el [phone].  Si tiene un asunto urgente cuando la clnica est cerrada y que no puede esperar hasta el siguiente da hbil, puede llamar/localizar a su doctor(a) al nmero que aparece a continuacin.   Por favor, tenga en cuenta que aunque hacemos todo lo posible para estar disponibles para asuntos urgentes fuera del horario de oficina, no estamos disponibles las 24 horas del da, los 7 das de la semana.   Si tiene un problema urgente y no puede comunicarse con nosotros, puede optar por buscar atencin mdica  en el consultorio de su doctor(a), en una clnica privada, en un centro de atencin urgente o en una sala de emergencias.  Si tiene una emergencia mdica, por favor llame inmediatamente al 911 o vaya a la sala de emergencias.  Nmeros de bper  - Dr. Kowalski: [phone]  - Dra. Moye: [phone]  - Dra. Stewart: [phone]  En caso de inclemencias del tiempo, por favor llame a nuestra lnea principal al [phone] para una actualizacin sobre el estado de cualquier retraso o cierre.  Consejos para la medicacin en dermatologa: Por favor, guarde las cajas en las que vienen los medicamentos de uso tpico para ayudarle a seguir las instrucciones sobre dnde y cmo usarlos. Las farmacias generalmente imprimen las instrucciones del medicamento slo en las cajas y no directamente en los tubos del medicamento.   Si su medicamento es muy caro, por favor, pngase en contacto con nuestra oficina llamando al [phone] y presione la opcin 4 o envenos un mensaje a travs de MyChart.   No podemos decirle cul ser su copago por los medicamentos por adelantado ya que esto es diferente dependiendo de la cobertura de su seguro. Sin embargo, es posible que podamos encontrar un medicamento sustituto a menor costo o llenar un formulario para que el  seguro cubra el medicamento que se considera necesario.   Si se requiere una autorizacin previa para que su compaa de seguros cubra su medicamento, por favor permtanos de 1 a 2 das hbiles para completar este proceso.  Los precios de los medicamentos varan con frecuencia dependiendo del lugar de dnde se surte la receta y alguna farmacias pueden ofrecer precios ms baratos.  El sitio web www.goodrx.com tiene cupones para medicamentos de diferentes farmacias. Los precios aqu no tienen en cuenta lo que podra costar con la ayuda del seguro (puede ser ms barato con su seguro), pero el sitio web puede darle el precio si no utiliz ningn seguro.  - Puede imprimir el cupn correspondiente y llevarlo con su receta a la farmacia.  - Tambin puede pasar por nuestra oficina durante el horario de atencin regular y recoger una tarjeta de cupones de GoodRx.  - Si necesita que su receta se enve electrnicamente a una farmacia diferente, informe a nuestra oficina a travs de MyChart [facility]   o por telfono llamando al [phone] y presione la opcin 4.  

## 2023-02-14 ENCOUNTER — Encounter: Payer: Self-pay | Admitting: Dermatology

## 2023-03-15 ENCOUNTER — Other Ambulatory Visit: Payer: Self-pay

## 2023-03-22 ENCOUNTER — Other Ambulatory Visit: Payer: Self-pay

## 2023-03-22 ENCOUNTER — Other Ambulatory Visit (HOSPITAL_COMMUNITY): Payer: Self-pay

## 2023-03-24 ENCOUNTER — Encounter: Payer: Self-pay | Admitting: Obstetrics

## 2023-04-07 ENCOUNTER — Ambulatory Visit: Payer: 59 | Admitting: Dermatology

## 2023-04-12 ENCOUNTER — Encounter: Payer: Self-pay | Admitting: Dermatology

## 2023-04-20 ENCOUNTER — Ambulatory Visit: Payer: 59 | Admitting: Dermatology

## 2023-04-20 VITALS — BP 128/83 | HR 64

## 2023-04-20 DIAGNOSIS — D2261 Melanocytic nevi of right upper limb, including shoulder: Secondary | ICD-10-CM

## 2023-04-20 DIAGNOSIS — Z86006 Personal history of melanoma in-situ: Secondary | ICD-10-CM

## 2023-04-20 DIAGNOSIS — D225 Melanocytic nevi of trunk: Secondary | ICD-10-CM | POA: Diagnosis not present

## 2023-04-20 DIAGNOSIS — L578 Other skin changes due to chronic exposure to nonionizing radiation: Secondary | ICD-10-CM

## 2023-04-20 DIAGNOSIS — L814 Other melanin hyperpigmentation: Secondary | ICD-10-CM | POA: Diagnosis not present

## 2023-04-20 DIAGNOSIS — Z1283 Encounter for screening for malignant neoplasm of skin: Secondary | ICD-10-CM

## 2023-04-20 DIAGNOSIS — Z7189 Other specified counseling: Secondary | ICD-10-CM

## 2023-04-20 DIAGNOSIS — Z85820 Personal history of malignant melanoma of skin: Secondary | ICD-10-CM | POA: Diagnosis not present

## 2023-04-20 DIAGNOSIS — W908XXA Exposure to other nonionizing radiation, initial encounter: Secondary | ICD-10-CM

## 2023-04-20 DIAGNOSIS — D2362 Other benign neoplasm of skin of left upper limb, including shoulder: Secondary | ICD-10-CM | POA: Diagnosis not present

## 2023-04-20 DIAGNOSIS — Z86018 Personal history of other benign neoplasm: Secondary | ICD-10-CM

## 2023-04-20 DIAGNOSIS — L821 Other seborrheic keratosis: Secondary | ICD-10-CM

## 2023-04-20 DIAGNOSIS — D229 Melanocytic nevi, unspecified: Secondary | ICD-10-CM

## 2023-04-20 DIAGNOSIS — R609 Edema, unspecified: Secondary | ICD-10-CM

## 2023-04-20 DIAGNOSIS — D239 Other benign neoplasm of skin, unspecified: Secondary | ICD-10-CM

## 2023-04-20 DIAGNOSIS — Z85828 Personal history of other malignant neoplasm of skin: Secondary | ICD-10-CM

## 2023-04-20 DIAGNOSIS — D485 Neoplasm of uncertain behavior of skin: Secondary | ICD-10-CM

## 2023-04-20 NOTE — Progress Notes (Signed)
Follow-Up Visit   Subjective  Shelley Davila is a 52 y.o. female who presents for the following: Skin Cancer Screening and Full Body Skin Exam. Patient c/o lump under the skin at severely dysplastic nevus excision site of the L middle medial thigh. The patient presents for Total-Body Skin Exam (TBSE) for skin cancer screening and mole check. The patient has spots, moles and lesions to be evaluated, some may be new or changing and the patient has concerns that these could be cancer.  The following portions of the chart were reviewed this encounter and updated as appropriate: medications, allergies, medical history  Review of Systems:  No other skin or systemic complaints except as noted in HPI or Assessment and Plan.  Objective  Well appearing patient in no apparent distress; mood and affect are within normal limits.  A full examination was performed including scalp, head, eyes, ears, nose, lips, neck, chest, axillae, abdomen, back, buttocks, bilateral upper extremities, bilateral lower extremities, hands, feet, fingers, toes, fingernails, and toenails. All findings within normal limits unless otherwise noted below.   Relevant physical exam findings are noted in the Assessment and Plan.  RUQA 8.0 cm sup lat to umbilicus Repigmented brown macule.   Assessment & Plan   SKIN CANCER SCREENING PERFORMED TODAY.  ACTINIC DAMAGE - Chronic condition, secondary to cumulative UV/sun exposure - diffuse scaly erythematous macules with underlying dyspigmentation - Recommend daily broad spectrum sunscreen SPF 30+ to sun-exposed areas, reapply every 2 hours as needed.  - Staying in the shade or wearing long sleeves, sun glasses (UVA+UVB protection) and wide brim hats (4-inch brim around the entire circumference of the hat) are also recommended for sun protection.  - Call for new or changing lesions.  LENTIGINES, SEBORRHEIC KERATOSES, HEMANGIOMAS - Benign normal skin lesions - Benign-appearing -  Call for any changes  MELANOCYTIC NEVI - Tan-brown and/or pink-flesh-colored symmetric macules and papules - Benign appearing on exam today - Observation - Call clinic for new or changing moles - Recommend daily use of broad spectrum spf 30+ sunscreen to sun-exposed areas.   Neoplasm of uncertain behavior of skin RUQA 8.0 cm sup lat to umbilicus  Epidermal / dermal shaving  Lesion diameter (cm):  0.6 Informed consent: discussed and consent obtained   Timeout: patient name, date of birth, surgical site, and procedure verified   Procedure prep:  Patient was prepped and draped in usual sterile fashion Prep type:  Isopropyl alcohol Anesthesia: the lesion was anesthetized in a standard fashion   Anesthetic:  1% lidocaine w/ epinephrine 1-100,000 buffered w/ 8.4% NaHCO3 Instrument used: flexible razor blade   Hemostasis achieved with: pressure, aluminum chloride and electrodesiccation   Outcome: patient tolerated procedure well   Post-procedure details: sterile dressing applied and wound care instructions given   Dressing type: bandage and petrolatum    Specimen 1 - Surgical pathology Differential Diagnosis: D48.5 repigmented brown macule, r/o recurrent dysplastic nevus  [id number] Check Margins: No  Skin cancer screening  Actinic skin damage  History of malignant melanoma  History of dysplastic nevus  Lentigo  Melanocytic nevus, unspecified location  History of basal cell carcinoma  Dermatofibroma  Swelling   HISTORY OF DYSPLASTIC NEVUS - L middle medial thigh - with SWELLING AT SURGERY SITE = hematoma vs scar tissue vs dermatofibroma in surgery site, consistent with post operative changes. Area of thickening 2.5 x 3.5 cm  No evidence of recurrence today Let area heal and see if resolves on its own.  Consider biopsy if enlarges (  pt says has gotten some smaller) Recommend regular full body skin exams Recommend daily broad spectrum sunscreen SPF 30+ to sun-exposed  areas, reapply every 2 hours as needed.  Call if any new or changing lesions are noted between office visits  HISTORY OF MELANOMA IN SITU - No evidence of recurrence today - Recommend regular full body skin exams - Recommend daily broad spectrum sunscreen SPF 30+ to sun-exposed areas, reapply every 2 hours as needed.  - Call if any new or changing lesions are noted between office visits  HISTORY OF BASAL CELL CARCINOMA OF THE SKIN - No evidence of recurrence today - Recommend regular full body skin exams - Recommend daily broad spectrum sunscreen SPF 30+ to sun-exposed areas, reapply every 2 hours as needed.  - Call if any new or changing lesions are noted between office visits  CELLULAR DERMATOFIBROMA - 0.7 cm Bx proven, persistent, L volar forearm  Exam: Firm pink/brown papulenodule with dimple sign. Treatment Plan: A dermatofibroma is a benign growth possibly related to trauma, such as an insect bite, cut from shaving, or inflamed acne-type bump.  Treatment options to remove include shave or excision with resulting scar and risk of recurrence.  Since benign-appearing and not bothersome, will observe for now. Discussed surgical excision if bothersome.   MELANOCYTIC NEVUS - bx proven on the R sup scapula, slight discoloration Exam: Tan-brown and/or pink-flesh-colored symmetric macules and papules Treatment Plan: Benign appearing on exam today. Recommend observation. Call clinic for new or changing moles. Recommend daily use of broad spectrum spf 30+ sunscreen to sun-exposed areas.  No tx needed.   Return in about 4 months (around 08/21/2023) for TBSE.  Maylene Roes, CMA, am acting as scribe for Armida Sans, MD .  Documentation: I have reviewed the above documentation for accuracy and completeness, and I agree with the above.  Armida Sans, MD

## 2023-04-20 NOTE — Patient Instructions (Addendum)
Wound Care Instructions  Cleanse wound gently with soap and water once a day then pat dry with clean gauze. Apply a thin coat of Petrolatum (petroleum jelly, "Vaseline") over the wound (unless you have an allergy to this). We recommend that you use a new, sterile tube of Vaseline. Do not pick or remove scabs. Do not remove the yellow or white "healing tissue" from the base of the wound.  Cover the wound with fresh, clean, nonstick gauze and secure with paper tape. You may use Band-Aids in place of gauze and tape if the wound is small enough, but would recommend trimming much of the tape off as there is often too much. Sometimes Band-Aids can irritate the skin.  You should call the office for your biopsy report after 1 week if you have not already been contacted.  If you experience any problems, such as abnormal amounts of bleeding, swelling, significant bruising, significant pain, or evidence of infection, please call the office immediately.  FOR ADULT SURGERY PATIENTS: If you need something for pain relief you may take 1 extra strength Tylenol (acetaminophen) AND 2 Ibuprofen (200mg  each) together every 4 hours as needed for pain. (do not take these if you are allergic to them or if you have a reason you should not take them.) Typically, you may only need pain medication for 1 to 3 days.      Mole A mole is a colored (pigmented) growth on the skin. Moles are very common. They are usually harmless, but some moles can become cancerous over time. What are the causes? Moles are caused when pigmented skin cells grow together in clusters instead of spreading out in the skin as they normally do. The reason why the skin cells grow together in clusters is not known. What increases the risk? You are more likely to develop a mole if: You have family members who have moles. You are fair skinned. You have red or blond hair. You are often outdoors and exposed to the sun. You received phototherapy when you  were a newborn baby. You are female. What are the signs or symptoms? A mole may occur anywhere on your skin. A mole may be: [place] or another color. Although moles are most often brown, they can also be tan, black, red, pink, blue, skin-toned, or colorless. Flat or raised. Smooth or wrinkled. Round in shape. How is this diagnosed? A mole is diagnosed with a skin exam. If your health care provider thinks a mole may be cancerous, all or part of the mole will be removed for testing (biopsy). How is this treated? Most moles are noncancerous (benign) and do not require treatment. If a mole is found to be cancerous, it will be removed. You may also choose to have a mole removed if it is causing pain or if you do not like the way it looks. Follow these instructions at home: General instructions  Every month, look for new moles and check your existing moles for changes. This is important because a change in a mole can mean that the mole has become cancerous. ABCDE changes in a mole indicate that you should be evaluated by your health care provider. ABCDE stands for: Asymmetry. This means the mole has an irregular shape. It is not round or oval. Border. This means the mole has an irregular or bumpy border. Color. This means the mole has multiple colors in it, including brown, black, blue, red, or tan. Note that it is normal for moles to get  darker when a woman is pregnant or takes birth control pills. Diameter. This means the mole is more than 0.2 inches (6 mm) across. Evolving. This refers to any unusual changes or symptoms in the mole, such as pain, itching, stinging, sensitivity, or bleeding. If you have a large number of moles, see a skin doctor (dermatologist) at least one time every year for a full-body skin check. Lifestyle  When you are outdoors, wear sunscreen with SPF 30 (sun protection factor 30) or higher. Use an adequate amount of sunscreen to cover exposed areas of skin. Put it on 30  minutes before you go out. Reapply it every 2 hours or anytime you come out of the water. When you are out in the sun, wear a broad-brimmed hat and clothing that covers your arms and legs. Wear wraparound sunglasses. Contact a health care provider if: The size, shape, borders, or color of your mole changes. Your mole, or the skin near the mole, becomes painful, sore, red, or swollen. Your mole: Develops more than one color. Itches or bleeds. Becomes scaly, sheds skin, or oozes fluid. Becomes flat or develops raised areas. Becomes hard or soft. You develop a new mole. Summary A mole is a colored (pigmented) growth on the skin. Moles are very common. They are usually harmless, but some moles can become cancerous over time. Every month, look for new moles and check your existing moles for changes. This is important because a change in a mole can mean that the mole has become cancerous. If you have a large number of moles, see a skin doctor (dermatologist) at least one time every year for a full-body skin check. When you are outdoors, wear sunscreen with SPF 30 (sun protection factor 30) or higher. Reapply it every 2 hours or anytime you come out of the water. Contact a health care provider if you notice changes in a mole or if you develop a new mole. This information is not intended to replace advice given to you by your health care provider. Make sure you discuss any questions you have with your health care provider. Document Revised: 06/19/2021 Document Reviewed: 06/19/2021 Elsevier Patient Education  2024 [employer].   Due to recent changes in healthcare laws, you may see results of your pathology and/or laboratory studies on MyChart before the doctors have had a chance to review them. We understand that in some cases there may be results that are confusing or concerning to you. Please understand that not all results are received at the same time and often the doctors may need to interpret  multiple results in order to provide you with the best plan of care or course of treatment. Therefore, we ask that you please give [place] 2 business days to thoroughly review all your results before contacting the office for clarification. Should we see a critical lab result, you will be contacted sooner.   If You Need Anything After Your Visit  If you have any questions or concerns for your doctor, please call our main line at [phone] and press option 4 to reach your doctor's medical assistant. If no one answers, please leave a voicemail as directed and we will return your call as soon as possible. Messages left after 4 pm will be answered the following business day.   You may also send [place] a message via MyChart. We typically respond to MyChart messages within 1-2 business days.  For prescription refills, please ask your pharmacy to contact our office. Our fax number  is [phone].  If you have an urgent issue when the clinic is closed that cannot wait until the next business day, you can page your doctor at the number below.    Please note that while we do our best to be available for urgent issues outside of office hours, we are not available 24/7.   If you have an urgent issue and are unable to reach [place], you may choose to seek medical care at your doctor's office, retail clinic, urgent care center, or emergency room.  If you have a medical emergency, please immediately call 911 or go to the emergency department.  Pager Numbers  - Dr. Gwen Pounds: [phone]  - Dr. Neale Burly: [phone]  - Dr. Roseanne Reno: [phone]  In the event of inclement weather, please call our main line at [phone] for an update on the status of any delays or closures.  Dermatology Medication Tips: Please keep the boxes that topical medications come in in order to help keep track of the instructions about where and how to use these. Pharmacies typically print the medication instructions only on the boxes and  not directly on the medication tubes.   If your medication is too expensive, please contact our office at [phone] option 4 or send [place] a message through MyChart.   We are unable to tell what your co-pay for medications will be in advance as this is different depending on your insurance coverage. However, we may be able to find a substitute medication at lower cost or fill out paperwork to get insurance to cover a needed medication.   If a prior authorization is required to get your medication covered by your insurance company, please allow [place] 1-2 business days to complete this process.  Drug prices often vary depending on where the prescription is filled and some pharmacies may offer cheaper prices.  The website www.goodrx.com contains coupons for medications through different pharmacies. The prices here do not account for what the cost may be with help from insurance (it may be cheaper with your insurance), but the website can give you the price if you did not use any insurance.  - You can print the associated coupon and take it with your prescription to the pharmacy.  - You may also stop by our office during regular business hours and pick up a GoodRx coupon card.  - If you need your prescription sent electronically to a different pharmacy, notify our office through [facility] or by phone at [phone] option 4.     Si Usted Necesita Algo Despus de Su Visita  Tambin puede enviarnos un mensaje a travs de Clinical cytogeneticist. Por lo general respondemos a los mensajes de MyChart en el transcurso de 1 a 2 das hbiles.  Para renovar recetas, por favor pida a su farmacia que se ponga en contacto con nuestra oficina. Annie Sable de fax es [place] [phone].  Si tiene un asunto urgente cuando la clnica est cerrada y que no puede esperar hasta el siguiente da hbil, puede llamar/localizar a su doctor(a) al nmero que aparece a continuacin.   Por favor, tenga en cuenta que aunque  hacemos todo lo posible para estar disponibles para asuntos urgentes fuera del horario de [place], no estamos disponibles las 24 horas del da, los 7 [address]  [address] de la [place].   Si tiene un problema urgente y no puede comunicarse con nosotros, puede optar por buscar atencin mdica  en el consultorio de su doctor(a), en una clnica privada, en un centro de atencin  urgente o en una sala de emergencias.  Si tiene Engineer, drilling, por favor llame inmediatamente al 911 o vaya a la sala de emergencias.  Nmeros de bper  - Dr. Gwen Pounds: [phone]  - Dra. Moye: [phone]  - Dra. Roseanne Reno: [phone]  En caso de inclemencias del [place], por favor llame a Lacy Duverney principal al [phone] para una actualizacin sobre el [place] de cualquier retraso o cierre.  Consejos para la medicacin en dermatologa: Por favor, guarde las cajas en las que vienen los medicamentos de uso tpico para ayudarle a seguir las instrucciones sobre dnde y cmo usarlos. Las farmacias generalmente imprimen las instrucciones del medicamento slo en las cajas y no directamente en los tubos del [place].   Si su medicamento es muy caro, por favor, pngase en contacto con Rolm Gala llamando al [phone] y presione la opcin 4 o envenos un mensaje a travs de Clinical cytogeneticist.   No podemos decirle cul ser su copago por los medicamentos por adelantado ya que esto es diferente dependiendo de la cobertura de su seguro. Sin embargo, es posible que podamos encontrar un medicamento sustituto a Audiological scientist un formulario para que el seguro cubra el medicamento que se considera necesario.   Si se requiere una autorizacin previa para que su compaa de seguros [place] su medicamento, por favor permtanos de 1 a 2 das hbiles para completar [address]  Los precios de los medicamentos varan con frecuencia dependiendo del Environmental consultant de dnde se surte la receta y alguna farmacias pueden ofrecer precios ms  baratos.  El sitio web www.goodrx.com tiene cupones para medicamentos de Health and safety inspector. Los precios aqu no tienen en cuenta lo que podra costar con la ayuda del seguro (puede ser ms barato con su seguro), pero el sitio web puede darle el precio si no utiliz Tourist information centre manager.  - Puede imprimir el cupn correspondiente y llevarlo con su receta a la farmacia.  - Tambin puede pasar por nuestra oficina durante el horario de atencin regular y Education officer, museum una tarjeta de cupones de GoodRx.  - Si necesita que su receta se enve electrnicamente a una farmacia diferente, informe a nuestra oficina a travs de MyChart de [place] o por telfono llamando al [phone] y presione la opcin 4.

## 2023-04-23 ENCOUNTER — Encounter: Payer: Self-pay | Admitting: Dermatology

## 2023-04-29 ENCOUNTER — Ambulatory Visit: Payer: 59 | Admitting: Dermatology

## 2023-04-29 ENCOUNTER — Telehealth: Payer: Self-pay

## 2023-04-29 NOTE — Telephone Encounter (Addendum)
Patient notified of results. No further questions. ----- Message from [name] sent at 04/29/2023 12:59 PM EDT ----- Skin , RUQA 8.0cm sup lat to umbilicus RECURRENT MELANOCYTIC NEVUS, LIMITED MARGINS FREE  Benign  - please call patient

## 2023-05-06 ENCOUNTER — Ambulatory Visit: Payer: 59 | Admitting: Dermatology

## 2023-05-10 ENCOUNTER — Ambulatory Visit: Payer: 59 | Admitting: Dermatology

## 2023-05-31 ENCOUNTER — Other Ambulatory Visit: Payer: Self-pay | Admitting: Internal Medicine

## 2023-05-31 DIAGNOSIS — Z1231 Encounter for screening mammogram for malignant neoplasm of breast: Secondary | ICD-10-CM

## 2023-06-23 ENCOUNTER — Telehealth: Payer: Self-pay | Admitting: Gastroenterology

## 2023-06-23 NOTE — Telephone Encounter (Signed)
Please schedule her an office visit as she had not been here since 10/01/2020. First available. Thank you.

## 2023-06-23 NOTE — Telephone Encounter (Signed)
Patient called in wanting to schedule her EGD. She was suppose to get it two ago but she could not pay for it. She is wanting to know if she have to come in the office visit. She said that you can message her back on mychart if that is easier.

## 2023-07-03 ENCOUNTER — Encounter (HOSPITAL_COMMUNITY): Payer: Self-pay

## 2023-07-03 ENCOUNTER — Other Ambulatory Visit (HOSPITAL_COMMUNITY): Payer: Self-pay

## 2023-07-05 ENCOUNTER — Other Ambulatory Visit: Payer: Self-pay

## 2023-07-05 ENCOUNTER — Other Ambulatory Visit (HOSPITAL_COMMUNITY): Payer: Self-pay

## 2023-07-06 NOTE — Telephone Encounter (Signed)
I called patient to schedule and office visit and there was no answer so I left a voicemail for her to call back.

## 2023-07-16 ENCOUNTER — Ambulatory Visit
Admission: RE | Admit: 2023-07-16 | Discharge: 2023-07-16 | Disposition: A | Discharge status: Home / Self Care | Payer: 59 | Source: Ambulatory Visit | Attending: Internal Medicine | Admitting: Internal Medicine

## 2023-07-16 DIAGNOSIS — Z1231 Encounter for screening mammogram for malignant neoplasm of breast: Secondary | ICD-10-CM

## 2023-07-22 IMAGING — MR MR LUMBAR SPINE W/O CM
5 series · 31 of 48 positions shown · non-contrast
Comparison: None.

CLINICAL DATA: Low back pain with sacroiliac joint pain for 1 year
after exercising.

EXAM:
MRI LUMBAR SPINE WITHOUT CONTRAST
TECHNIQUE: Multiplanar, multisequence MR imaging of the lumbar spine was
performed. No intravenous contrast was administered.

[Series 5: T2 · sagittal · 4.0mm · 0.81mm/px · 6 of 15 slices shown (1 of 2)]
[im 1/15]
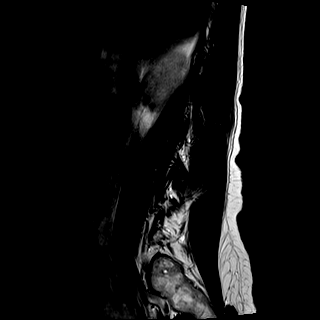
[im 3/15]
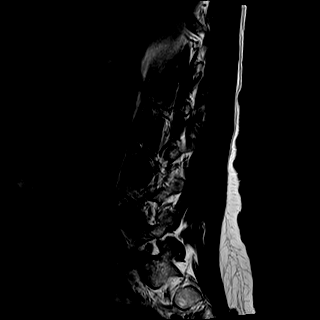
[im 6/15]
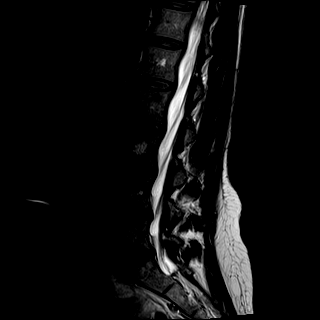
[im 9/15]
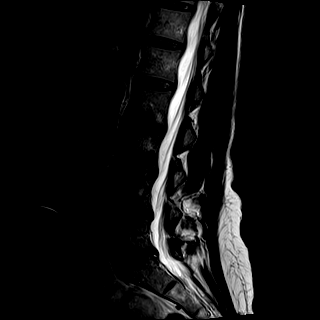
[im 12/15]
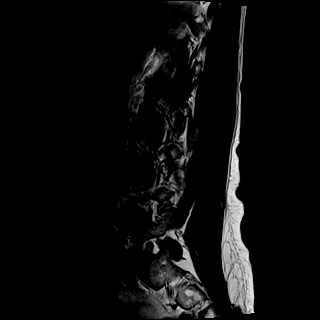
[im 15/15]
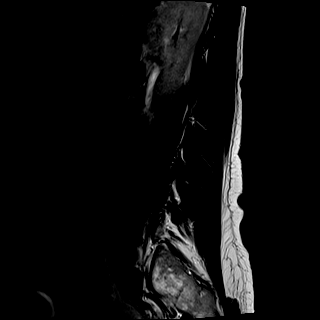

[Series 6: T1 · sagittal · 4.0mm · 0.81mm/px · 6 of 15 slices shown (1 of 2)]
[im 1/15]
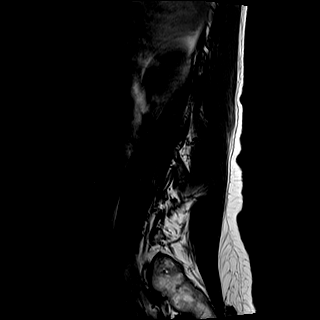
[im 3/15]
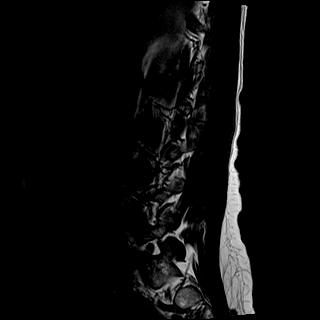
[im 6/15]
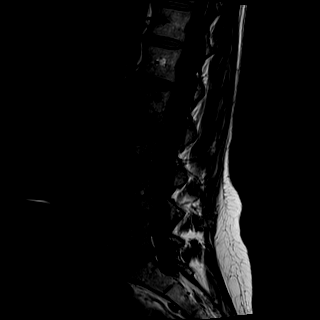
[im 9/15]
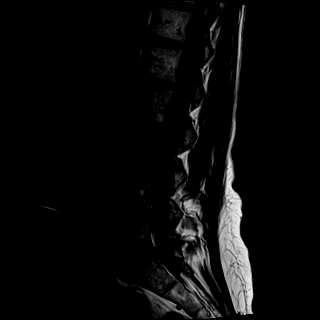
[im 12/15]
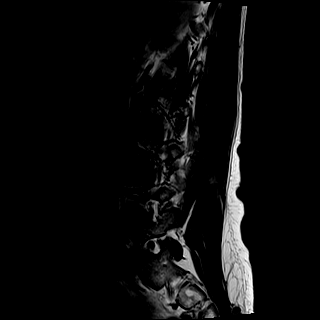
[im 15/15]
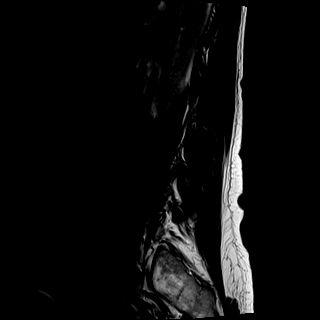

[Series 7: STIR · sagittal · 4.0mm · 0.41mm/px · 1 of 15 slices shown]
[im 1/15]
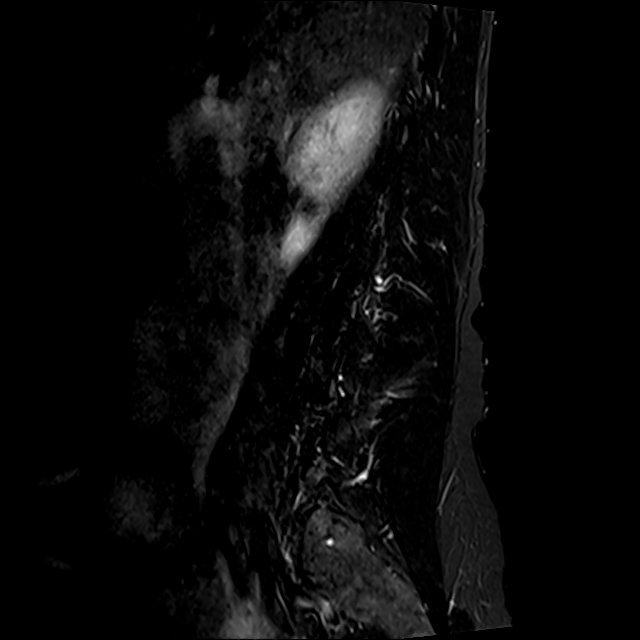

[Series 8: T2 · axial · 4.0mm · 0.78mm/px · z∈[-138,+89]mm · 9 of 37 slices shown (2 of 2)]
[im 1/37]
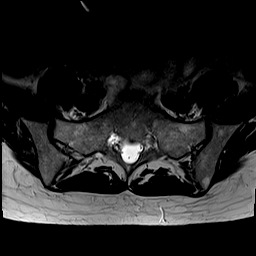
[im 6/37]
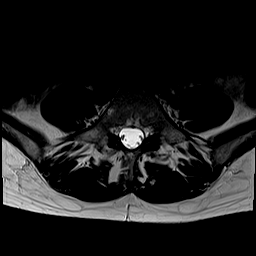
[im 11/37]
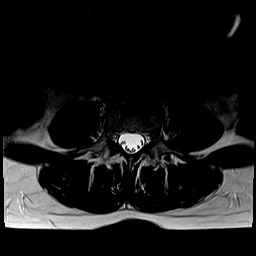
[im 16/37]
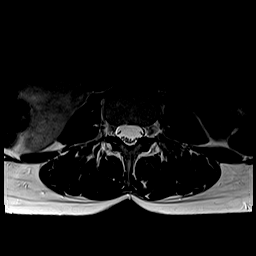
[im 19/37]
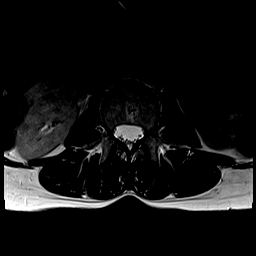
[im 21/37]
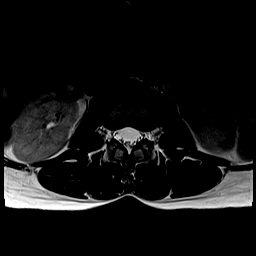
[im 26/37]
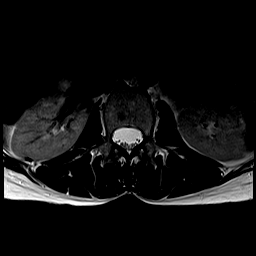
[im 31/37]
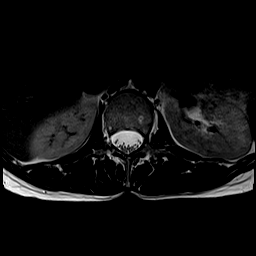
[im 37/37]
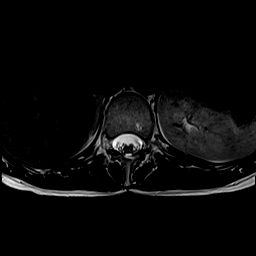

[Series 9: T1 · axial · 4.0mm · 0.39mm/px · z∈[-138,+89]mm · 9 of 37 slices shown (2 of 2)]
[im 1/37]
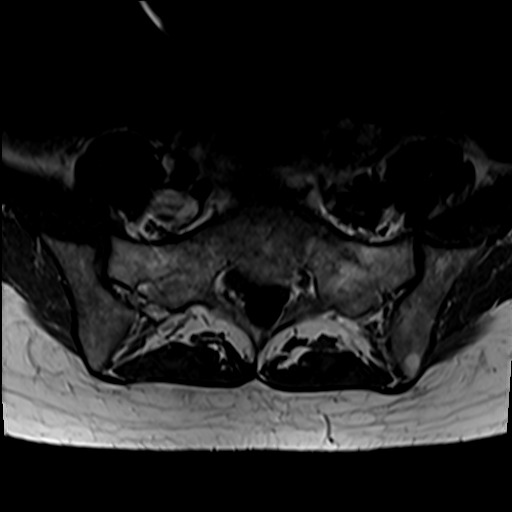
[im 6/37]
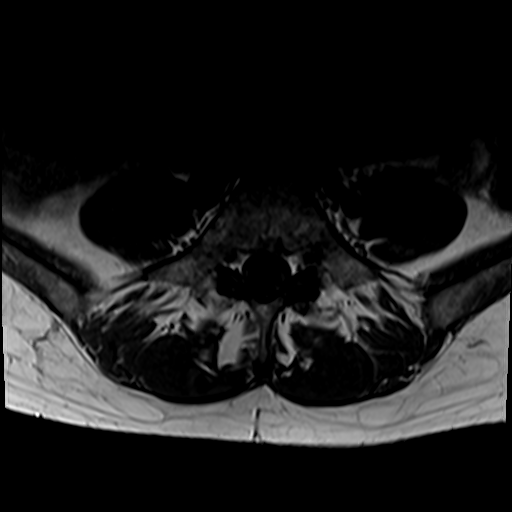
[im 11/37]
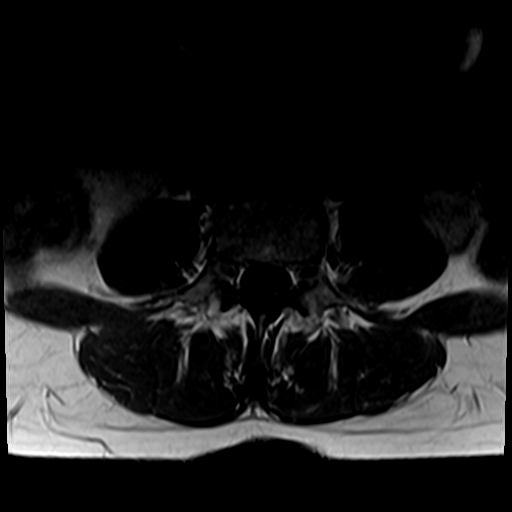
[im 16/37]
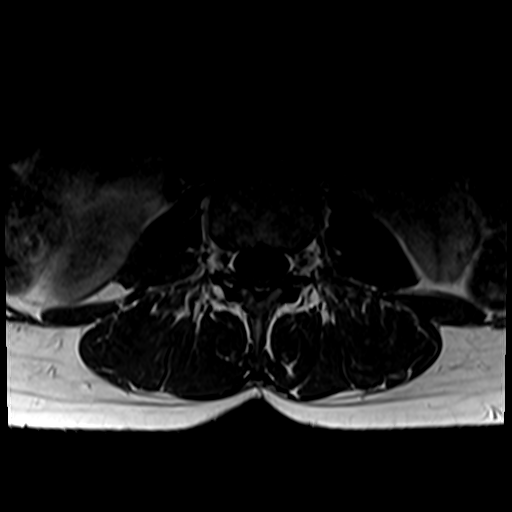
[im 19/37]
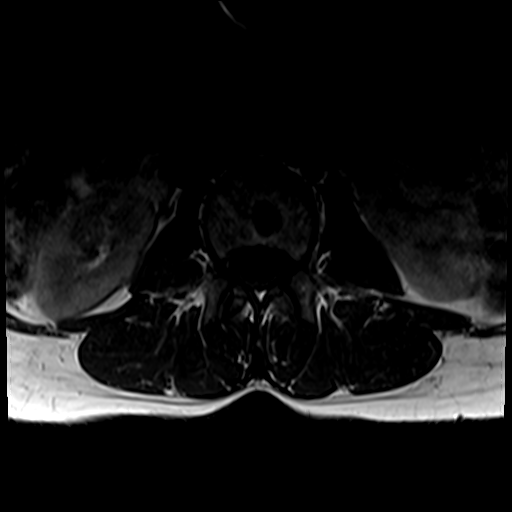
[im 21/37]
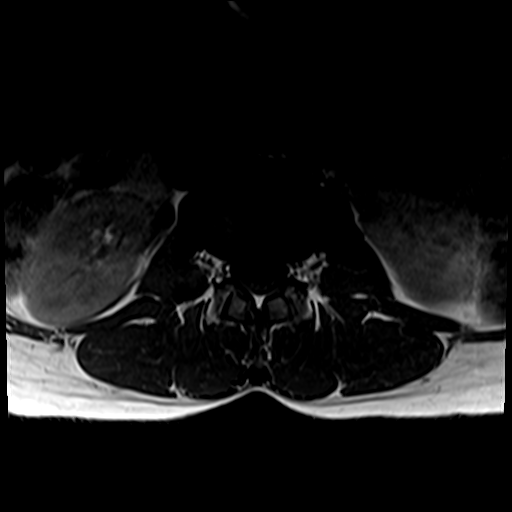
[im 26/37]
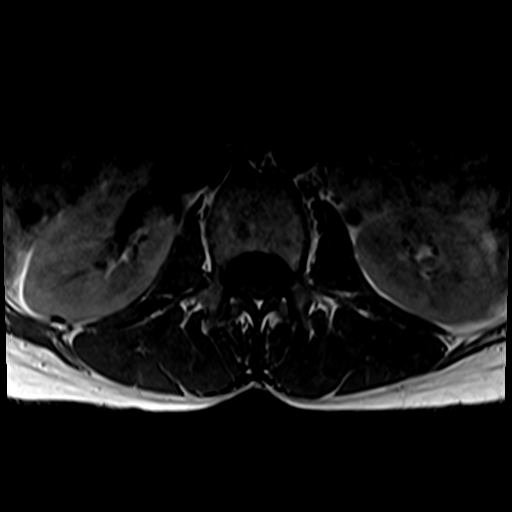
[im 31/37]
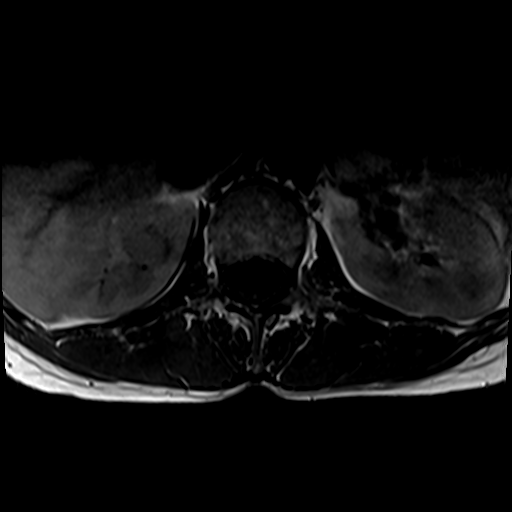
[im 37/37]
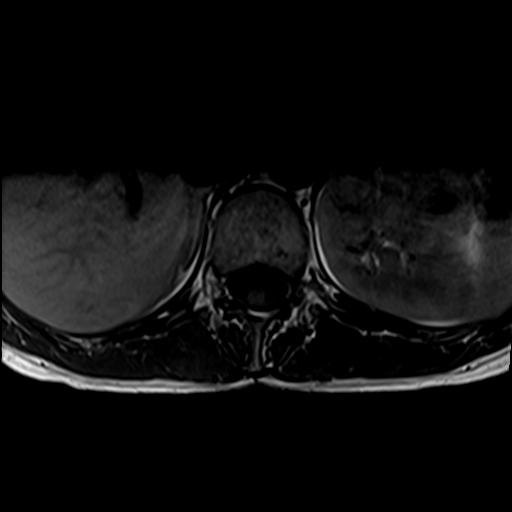

[31 of 48 positions shown; findings below may reference images not displayed]

FINDINGS: Segmentation:  5 lumbar type vertebrae

Alignment:  Physiologic.

Vertebrae: Rounded hypointense area in the L3 body has a stippled
appearance and a few T1 hyperintense foci most consistent with fat
poor hemangioma. Other more typical hemangiomas are also noted. No
fracture or visible inflammation.

Conus medullaris and cauda equina: Conus extends to the L1 level.
Conus and cauda equina appear normal.

Paraspinal and other soft tissues: No evidence of perispinal mass or
inflammation.

Disc levels:

Well preserved disc height and hydration. Annular fissures and disc
bulging at L4-5 and L5-S1 with shallow central protrusion at L5-S1.
Negative facets. No neural compression.
IMPRESSION: Lower lumbar annular fissuring with small L5-S1 disc protrusion. No
neural impingement or visible inflammation.

## 2023-08-12 ENCOUNTER — Ambulatory Visit: Payer: Self-pay | Admitting: Dermatology

## 2023-08-12 DIAGNOSIS — I781 Nevus, non-neoplastic: Secondary | ICD-10-CM

## 2023-08-12 NOTE — Patient Instructions (Signed)
BEFORE YOUR APPOINTMENT FOR SCLEROTHERAPY  1. When you telephone for your appointment for the sclerotherapy procedure, please let the receptionist know that you are scheduling for the fifteen (15) minute sclerotherapy procedure not just a regular visit.  2. On the day of the procedure, please cleanse and dry the areas, but do not use any moisturizers or other products on the area(s) to be treated.  3. Bring a pair of comfortable shorts to wear during the procedure.  4. Be sure to bring your recommended graduated compression stockings with you to the office. You will be wearing them home when your visit is over. These compression hose can be purchased at most medical supply stores.  After Your Sclerotherapy Procedure  1. Please wear the graduated compression stockings for 24 hours immediately following the completion of the sclerotherapy procedure.  2. We recommend that you avoid vigorous activity as much as possible for the first twenty-four (24) hours. You can do your "normal" routine, but avoid an above normal amount of time on your feet. Elevating the legs when sitting and avoidance of vigorous leg movements or exercise in the first few days after treatment may improve your results.  3. You may remove the compression dressings (cotton balls) and tape the next morning.  4. Please continue wearing the compression stockings during waking hours for the two (2) weeks following sclerotherapy.  5. If you have any blisters, sores or ulcers or other problems following your procedure please call or return to the office immediately.     THE PROCEDURE FEE IS $350.00 PER FIFTEEN (15) MINUTE SESSION. WE REQUIRE THAT THIS PROCEDURE BE PAID FOR IN FULL ON OR BEFORE THE DATE THAT IT IS PERFORMED. WE WILL GIVE YOU A RECEIPT THAT YOU CAN FILE WITH YOUR INSURANCE COMPANY. WE GENERALLY DO NOT FILE THIS PROCEDURE WITH ANY INSURANCE COMPANY EXCEPT UNDER CERTAIN CIRCUMSTANCES WHERE PRIOR AUTHORIZATION HAS BEEN  CONFIRMED. THIS PROCEDURE IS GENERALLY CONSIDERED TO BE A COSMETIC PROCEDURE BY INSURANCE COMPANIES. 

## 2023-08-12 NOTE — Progress Notes (Signed)
   Follow-Up Visit   Subjective  Shelley Davila is a 52 y.o. female who presents for the following: Sclerotherapy   The following portions of the chart were reviewed this encounter and updated as appropriate: medications, allergies, medical history  Review of Systems:  No other skin or systemic complaints except as noted in HPI or Assessment and Plan.  Objective  Well appearing patient in no apparent distress; mood and affect are within normal limits.  A focused examination was performed of the following areas: Legs   Relevant exam findings are noted in the Assessment and Plan.    Assessment & Plan   Spider veins of both lower extremities legs  Asclera 1% NDC [id number]  Lot # [id number]  Exp: 07/2024   The patient presents for desired sclerotherapy for treatment of small to medium blue  varicosities of the legs.  Procedure: The patient was counseled and understands about the effects, side effects and potential risks and complications of the sclerotherapy procedure. The patient was given the opportunity to ask questions. Asclera (polidocanol) 1% (total 2cc) was injected into the varices. In order to ensure correct placement of the catheter in the vein, I drew back slightly to give moderate blood show in the syringe. If there was any evidence or suspicion of extravasation of sclerosant, the area was immediately diluted with a large volume of 0.9% saline. A pressure dressing was applied immediately to the injected sites. The patient tolerated the procedure well without complication. The patient was instructed in post-operative compression stocking use. The patient understands to call or return immediately if any problems noted.    Return for TBSE, with Dr. Kirtland Bouchard, as scheduled, Hx MMis, Hx Dysplastic Nevi, Hx BCC.  Anise Salvo, RMA, am acting as scribe for Armida Sans, MD .   Documentation: I have reviewed the above documentation for accuracy and completeness, and I agree with  the above.  Armida Sans, MD

## 2023-08-23 ENCOUNTER — Encounter: Payer: Self-pay | Admitting: Dermatology

## 2023-08-24 ENCOUNTER — Other Ambulatory Visit: Payer: Self-pay

## 2023-08-24 NOTE — Progress Notes (Signed)
Shelley Amy, PA-C [address]  [address]  [place], [place] [address]  Main: [phone]  Fax: [phone]   Primary Care Physician: Danella Penton, MD  Primary Gastroenterologist:  Shelley Amy, PA-C / Dr. Wyline Mood    CC:  GERD, Epigastric Pain  HPI: Shelley Davila is a 52 y.o. female presents for worsening GERD, acid reflux, and epigastric pain.  She has history of GERD for 25 years.  Has been on omeprazole 20 mg daily for many years.  20 mg daily stopped working and she increased to 40 mg daily which helped.  She was concerned about long-term adverse side effect of PPI, therefore she decreased omeprazole back to 20 mg once daily.  If she misses 1 dose of medicine, then she has severe acid reflux.  She eats very healthy.  She feels burning in the epigastrium.  She has occasional nausea but no vomiting.  She has never had an EGD.  She is requesting to schedule an EGD.  Family history significant for her mother who had Barrett's esophagus.  Colonoscopy by Dr. Tobi Bastos 11/2020 was normal.  10-year repeat.   Current Outpatient Medications  Medication Sig Dispense Refill   Levonorgestrel-Ethinyl Estradiol (AMETHIA) 0.15-0.03 &0.01 MG tablet Take 1 tablet by mouth daily. 91 tablet 4   Multiple Vitamin (MULTIVITAMIN) tablet Take 1 tablet by mouth daily.     omeprazole (PRILOSEC OTC) 20 MG tablet Take 20 mg by mouth daily.     omeprazole (PRILOSEC) 20 MG capsule Take 1 capsule (20 mg total) by mouth daily. 90 capsule 3   No current facility-administered medications for this visit.    Allergies as of 08/25/2023 - Review Complete 08/25/2023  Allergen Reaction Noted   Bee venom Anaphylaxis 02/28/2016    Past Medical History:  Diagnosis Date   Basal cell carcinoma 06/24/2022   L neck inframandibular - ED&C   Complication of anesthesia    hard to wake up   Dysplastic nevus 03/31/2022   Left lat deltoid - Moderate   Dysplastic nevus 03/31/2022   Right UQA 8.0 cm lat to umbilicus  - Moderate   Dysplastic nevus 03/31/2022   Left medial breast - Moderate   Dysplastic nevus 01/03/2022   L middle medial thigh - severe, excised 02/02/23   Fibroadenoma of both breasts    has had multiple lumpectomies and biopsies of both breasts   Melanoma (HCC) 01/05/2022   left mid lateral pretibial, Melanoma IS, Excised 03/24/22    Past Surgical History:  Procedure Laterality Date   BREAST BIOPSY Bilateral    X2   BREAST SURGERY     multiple lumpectomies and biopsies on both breasts   COLONOSCOPY WITH PROPOFOL N/A 11/22/2020   Procedure: COLONOSCOPY WITH PROPOFOL;  Surgeon: Wyline Mood, MD;  [facility] ENDOSCOPY;  Service: Gastroenterology;  Laterality: N/A;   EXCISION OF BREAST BIOPSY Right 03/05/2016   Procedure: EXCISION OF TWO FIBROADENOMAS RIGHT  BREAST ;  Surgeon: Manus Rudd, MD;  Location: [facility];  Service: General;  Laterality: Right;    Review of Systems:    All systems reviewed and negative except where noted in HPI.   Physical Examination:   BP 138/64   Pulse 61   Temp 98 F (36.7 C) (Oral)   Ht 5' 6.75" (1.695 m)   Wt 140 lb 2 oz (63.6 kg)   BMI 22.11 kg/m   General: Well-nourished, well-developed in no acute distress.  Lungs: Clear to auscultation bilaterally. Non-labored.  Heart: Regular rate and rhythm, no murmurs rubs or gallops.  Abdomen: Bowel sounds are normal; Abdomen is Soft; No hepatosplenomegaly, masses or hernias;  Mild to Moderate Abdominal Tenderness; No guarding or rebound tenderness. Neuro: Alert and oriented x 3.  Grossly intact.  Psych: Alert and cooperative, normal mood and affect.   Imaging Studies: No results found.  Assessment and Plan:   [name] is a 52 y.o. y/o female returns for worsening:  GERD for 25 years Schedule EGD to Screen for Barrett's.  Rx Omeprazole 20mg  1 - 2 tablets daily. Recommend Lifestyle Modifications to prevent Acid Reflux.  Rec. Avoid coffee, sodas, peppermint, citrus  fruits, and spicey foods.  Avoid eating 2-3 hours before bedtime.  We discussed adverse side effects of PPIs to include vitamin deficiencies, osteoporosis, renal insufficiency, dementia and increased risk of C. Difficile.  Recommend take lowest effective dose of PPI necessary to control acid reflux.   Add H2RB (Pepcid 20mg  daily) or antiacid (TUMS) if needed for breakthrough acid reflux.   Epigastric Pain  Scheduling EGD (**Check Bx for H. Pylori) I discussed risks of EGD with patient to include risk of bleeding, perforation, and risk of sedation.  Patient expressed understanding and agrees to proceed with EGD.   3.  Family History of Barrett's in Mother  Scheduling EGD to Screen for Barretts   Shelley Amy, PA-C  Follow up as needed based on the EGD results and GI symptoms.

## 2023-08-25 ENCOUNTER — Other Ambulatory Visit: Payer: Self-pay

## 2023-08-25 ENCOUNTER — Other Ambulatory Visit (HOSPITAL_COMMUNITY): Payer: Self-pay

## 2023-08-25 ENCOUNTER — Encounter: Payer: Self-pay | Admitting: Physician Assistant

## 2023-08-25 ENCOUNTER — Ambulatory Visit: Payer: 59 | Admitting: Physician Assistant

## 2023-08-25 VITALS — BP 138/64 | HR 61 | Temp 98.0°F | Ht 66.75 in | Wt 140.1 lb

## 2023-08-25 DIAGNOSIS — M25852 Other specified joint disorders, left hip: Secondary | ICD-10-CM | POA: Diagnosis not present

## 2023-08-25 DIAGNOSIS — K219 Gastro-esophageal reflux disease without esophagitis: Secondary | ICD-10-CM

## 2023-08-25 DIAGNOSIS — Z8379 Family history of other diseases of the digestive system: Secondary | ICD-10-CM | POA: Diagnosis not present

## 2023-08-25 DIAGNOSIS — R1013 Epigastric pain: Secondary | ICD-10-CM

## 2023-08-25 DIAGNOSIS — M25552 Pain in left hip: Secondary | ICD-10-CM | POA: Diagnosis not present

## 2023-08-25 MED ORDER — OMEPRAZOLE 20 MG PO CPDR
20.0000 mg | DELAYED_RELEASE_CAPSULE | Freq: Every day | ORAL | 3 refills | Status: DC
  Filled 2023-08-25: qty 90, 90d supply, fill #0

## 2023-08-26 ENCOUNTER — Other Ambulatory Visit: Payer: Self-pay | Admitting: Sports Medicine

## 2023-08-26 DIAGNOSIS — M25552 Pain in left hip: Secondary | ICD-10-CM

## 2023-08-26 DIAGNOSIS — M25852 Other specified joint disorders, left hip: Secondary | ICD-10-CM

## 2023-09-02 ENCOUNTER — Ambulatory Visit: Payer: 59 | Admitting: Dermatology

## 2023-09-02 DIAGNOSIS — W908XXA Exposure to other nonionizing radiation, initial encounter: Secondary | ICD-10-CM | POA: Diagnosis not present

## 2023-09-02 DIAGNOSIS — D1801 Hemangioma of skin and subcutaneous tissue: Secondary | ICD-10-CM

## 2023-09-02 DIAGNOSIS — R2242 Localized swelling, mass and lump, left lower limb: Secondary | ICD-10-CM

## 2023-09-02 DIAGNOSIS — Z86018 Personal history of other benign neoplasm: Secondary | ICD-10-CM

## 2023-09-02 DIAGNOSIS — L814 Other melanin hyperpigmentation: Secondary | ICD-10-CM

## 2023-09-02 DIAGNOSIS — Z85820 Personal history of malignant melanoma of skin: Secondary | ICD-10-CM

## 2023-09-02 DIAGNOSIS — L821 Other seborrheic keratosis: Secondary | ICD-10-CM | POA: Diagnosis not present

## 2023-09-02 DIAGNOSIS — L7633 Postprocedural seroma of skin and subcutaneous tissue following a dermatologic procedure: Secondary | ICD-10-CM

## 2023-09-02 DIAGNOSIS — D229 Melanocytic nevi, unspecified: Secondary | ICD-10-CM

## 2023-09-02 DIAGNOSIS — Z1283 Encounter for screening for malignant neoplasm of skin: Secondary | ICD-10-CM | POA: Diagnosis not present

## 2023-09-02 DIAGNOSIS — L578 Other skin changes due to chronic exposure to nonionizing radiation: Secondary | ICD-10-CM | POA: Diagnosis not present

## 2023-09-02 DIAGNOSIS — Z85828 Personal history of other malignant neoplasm of skin: Secondary | ICD-10-CM

## 2023-09-02 NOTE — Patient Instructions (Signed)

## 2023-09-02 NOTE — Progress Notes (Signed)
   Follow-Up Visit   Subjective  Shelley Davila is a 52 y.o. female who presents for the following: Skin Cancer Screening and Full Body Skin Exam  The patient presents for Total-Body Skin Exam (TBSE) for skin cancer screening and mole check. The patient has spots, moles and lesions to be evaluated, some may be new or changing and the patient may have concern these could be cancer.   The following portions of the chart were reviewed this encounter and updated as appropriate: medications, allergies, medical history  Review of Systems:  No other skin or systemic complaints except as noted in HPI or Assessment and Plan.  Objective  Well appearing patient in no apparent distress; mood and affect are within normal limits.  A full examination was performed including scalp, head, eyes, ears, nose, lips, neck, chest, axillae, abdomen, back, buttocks, bilateral upper extremities, bilateral lower extremities, hands, feet, fingers, toes, fingernails, and toenails. All findings within normal limits unless otherwise noted below.   Relevant physical exam findings are noted in the Assessment and Plan.   Assessment & Plan   SKIN CANCER SCREENING PERFORMED TODAY.  ACTINIC DAMAGE - Chronic condition, secondary to cumulative UV/sun exposure - diffuse scaly erythematous macules with underlying dyspigmentation - Recommend daily broad spectrum sunscreen SPF 30+ to sun-exposed areas, reapply every 2 hours as needed.  - Staying in the shade or wearing long sleeves, sun glasses (UVA+UVB protection) and wide brim hats (4-inch brim around the entire circumference of the hat) are also recommended for sun protection.  - Call for new or changing lesions.  LENTIGINES, SEBORRHEIC KERATOSES, HEMANGIOMAS - Benign normal skin lesions - Benign-appearing - Call for any changes  MELANOCYTIC NEVI - Tan-brown and/or pink-flesh-colored symmetric macules and papules - Benign appearing on exam today - Observation - Call  clinic for new or changing moles - Recommend daily use of broad spectrum spf 30+ sunscreen to sun-exposed areas.   HISTORY OF BASAL CELL CARCINOMA OF THE SKIN - No evidence of recurrence today - Recommend regular full body skin exams - Recommend daily broad spectrum sunscreen SPF 30+ to sun-exposed areas, reapply every 2 hours as needed.  - Call if any new or changing lesions are noted between office visits  HISTORY OF MELANOMA - No evidence of recurrence today - No lymphadenopathy - Recommend regular full body skin exams - Recommend daily broad spectrum sunscreen SPF 30+ to sun-exposed areas, reapply every 2 hours as needed.  - Call if any new or changing lesions are noted between office visits  HISTORY OF DYSPLASTIC NEVUS No evidence of recurrence today Recommend regular full body skin exams Recommend daily broad spectrum sunscreen SPF 30+ to sun-exposed areas, reapply every 2 hours as needed.  Call if any new or changing lesions are noted between office visits  Possible Seroma  Exam: Subcutaneous nodule at L middle med thigh at severely dysplastic nevus site, 3.0 cm, confirmed by U/S, images scanned into chart.   Benign-appearing. Discussed aspiration if bothersome, but not recommended at this time. Will likely resolve on its own over time.  Return in about 4 months (around 12/31/2023) for TBSE.  Maylene Roes, CMA, am acting as scribe for Armida Sans, MD .   Documentation: I have reviewed the above documentation for accuracy and completeness, and I agree with the above.  Armida Sans, MD

## 2023-09-07 ENCOUNTER — Encounter: Payer: Self-pay | Admitting: Dermatology

## 2023-09-30 ENCOUNTER — Other Ambulatory Visit: Payer: Self-pay

## 2023-10-04 ENCOUNTER — Ambulatory Visit
Admission: RE | Admit: 2023-10-04 | Discharge: 2023-10-04 | Disposition: A | Discharge status: Home / Self Care | Payer: Commercial Managed Care - PPO | Source: Ambulatory Visit | Attending: Sports Medicine | Admitting: Sports Medicine

## 2023-10-04 ENCOUNTER — Ambulatory Visit
Admission: RE | Admit: 2023-10-04 | Discharge: 2023-10-04 | Disposition: A | Discharge status: Home / Self Care | Payer: 59 | Source: Ambulatory Visit | Attending: Sports Medicine | Admitting: Sports Medicine

## 2023-10-04 DIAGNOSIS — M25852 Other specified joint disorders, left hip: Secondary | ICD-10-CM | POA: Diagnosis not present

## 2023-10-04 DIAGNOSIS — M25552 Pain in left hip: Secondary | ICD-10-CM | POA: Diagnosis not present

## 2023-10-04 DIAGNOSIS — M24852 Other specific joint derangements of left hip, not elsewhere classified: Secondary | ICD-10-CM | POA: Diagnosis not present

## 2023-10-04 MED ORDER — GADOBUTROL 1 MMOL/ML IV SOLN
0.0500 mL | Freq: Once | INTRAVENOUS | Status: AC | PRN
Start: 2023-10-04 — End: 2023-10-04
  Administered 2023-10-04: 0.05 mL

## 2023-10-04 MED ORDER — IOHEXOL 180 MG/ML  SOLN
30.0000 mL | Freq: Once | INTRAMUSCULAR | Status: AC | PRN
  Administered 2023-10-04: 30 mL

## 2023-10-04 MED ORDER — LIDOCAINE HCL (PF) 1 % IJ SOLN
5.0000 mL | Freq: Once | INTRAMUSCULAR | Status: AC
  Administered 2023-10-04: 5 mL
  Filled 2023-10-04: qty 5

## 2023-10-25 ENCOUNTER — Ambulatory Visit: Payer: Commercial Managed Care - PPO

## 2023-10-25 ENCOUNTER — Ambulatory Visit
Admission: RE | Admit: 2023-10-25 | Discharge: 2023-10-25 | Disposition: A | Discharge status: Home / Self Care | Payer: Commercial Managed Care - PPO | Attending: Gastroenterology | Admitting: Gastroenterology

## 2023-10-25 ENCOUNTER — Encounter: Payer: Self-pay | Admitting: Gastroenterology

## 2023-10-25 ENCOUNTER — Encounter
Admission: RE | Disposition: A | Discharge status: Home / Self Care | Payer: Self-pay | Source: Home / Self Care | Attending: Gastroenterology

## 2023-10-25 DIAGNOSIS — K2960 Other gastritis without bleeding: Secondary | ICD-10-CM | POA: Diagnosis not present

## 2023-10-25 DIAGNOSIS — K2950 Unspecified chronic gastritis without bleeding: Secondary | ICD-10-CM | POA: Diagnosis not present

## 2023-10-25 DIAGNOSIS — K2970 Gastritis, unspecified, without bleeding: Secondary | ICD-10-CM

## 2023-10-25 DIAGNOSIS — K219 Gastro-esophageal reflux disease without esophagitis: Secondary | ICD-10-CM | POA: Insufficient documentation

## 2023-10-25 HISTORY — PX: BIOPSY: SHX5522

## 2023-10-25 HISTORY — PX: ESOPHAGOGASTRODUODENOSCOPY (EGD) WITH PROPOFOL: SHX5813

## 2023-10-25 LAB — POCT PREGNANCY, URINE: Preg Test, Ur: NEGATIVE

## 2023-10-25 SURGERY — ESOPHAGOGASTRODUODENOSCOPY (EGD) WITH PROPOFOL
Anesthesia: General

## 2023-10-25 MED ORDER — PROPOFOL 1000 MG/100ML IV EMUL
INTRAVENOUS | Status: AC
  Filled 2023-10-25: qty 100

## 2023-10-25 MED ORDER — SODIUM CHLORIDE 0.9 % IV SOLN
INTRAVENOUS | Status: DC
  Administered 2023-10-25: 20 mL/h via INTRAVENOUS

## 2023-10-25 MED ORDER — LIDOCAINE HCL (PF) 2 % IJ SOLN
INTRAMUSCULAR | Status: DC | PRN
  Administered 2023-10-25: 100 mg via INTRADERMAL

## 2023-10-25 MED ORDER — PROPOFOL 500 MG/50ML IV EMUL
INTRAVENOUS | Status: DC | PRN
  Administered 2023-10-25: 200 ug/kg/min via INTRAVENOUS
  Administered 2023-10-25: 120 mg via INTRAVENOUS

## 2023-10-25 MED ORDER — LIDOCAINE HCL (PF) 2 % IJ SOLN
INTRAMUSCULAR | Status: AC
  Filled 2023-10-25: qty 5

## 2023-10-25 NOTE — H&P (Signed)
 Ruel Kung, MD [address], [place], [place], [address] [address], [address], [place], [place], [address] Phone: [phone]  Fax: [phone]  Primary Care Physician:  Cleotilde Oneil FALCON, MD   Pre-Procedure History & Physical: HPI:  LENIYAH MARTELL is a 53 y.o. female is here for an endoscopy    Past Medical History:  Diagnosis Date   Basal cell carcinoma 06/24/2022   L neck inframandibular - ED&C   Complication of anesthesia    hard to wake up   Dysplastic nevus 03/31/2022   Left lat deltoid - Moderate   Dysplastic nevus 03/31/2022   Right UQA 8.0 cm lat to umbilicus - Moderate   Dysplastic nevus 03/31/2022   Left medial breast - Moderate   Dysplastic nevus 01/03/2022   L middle medial thigh - severe, excised 02/02/23   Fibroadenoma of both breasts    has had multiple lumpectomies and biopsies of both breasts   Melanoma (HCC) 01/05/2022   left mid lateral pretibial, Melanoma IS, Excised 03/24/22    Past Surgical History:  Procedure Laterality Date   BREAST BIOPSY Bilateral    X2   BREAST SURGERY     multiple lumpectomies and biopsies on both breasts   COLONOSCOPY WITH PROPOFOL  N/A 11/22/2020   Procedure: COLONOSCOPY WITH PROPOFOL ;  Surgeon: Kung Ruel, MD;  Location: [facility] ENDOSCOPY;  Service: Gastroenterology;  Laterality: N/A;   EXCISION OF BREAST BIOPSY Right 03/05/2016   Procedure: EXCISION OF TWO FIBROADENOMAS RIGHT  BREAST ;  Surgeon: Donnice Lima, MD;  Location: [facility];  Service: General;  Laterality: Right;    Prior to Admission medications   Medication Sig Start Date End Date Taking? Authorizing Provider  Levonorgestrel -Ethinyl Estradiol  (AMETHIA) 0.15-0.03 &0.01 MG tablet Take 1 tablet by mouth daily. 01/04/23  Yes Carlin Rollene HERO, CNM  Multiple Vitamin (MULTIVITAMIN) tablet Take 1 tablet by mouth daily.   Yes [provider]  omeprazole  (PRILOSEC OTC) 20 MG tablet Take 20 mg by mouth daily.   Yes [provider]  omeprazole  (PRILOSEC) 20 MG capsule Take 1 capsule (20 mg total) by mouth daily. 08/25/23 08/19/24 Yes [place], PA-C    Allergies as of 08/25/2023 - Review Complete 08/25/2023  Allergen Reaction Noted   Bee venom Anaphylaxis 02/28/2016    Family History  Problem Relation Age of Onset   COPD Mother    Hypercholesterolemia Mother    Arthritis Mother    Arthritis Father    Breast cancer Neg Hx     Social History   Socioeconomic History   Marital status: Married    Spouse name: Not on file   Number of children: Not on file   Years of education: Not on file   Highest education level: Not on file  Occupational History   Not on file  Tobacco Use   Smoking status: Never   Smokeless tobacco: Never  Vaping Use   Vaping status: Never Used  Substance and Sexual Activity   Alcohol use: Not Currently    Comment: social   Drug use: No   Sexual activity: Yes    Birth control/protection: Other-see comments    Comment: husband has had vasectomy  Other Topics Concern   Not on file  Social History Narrative   Not on file   Social Drivers of Health   Financial Resource Strain: Not on file  Food Insecurity: Not on file  Transportation Needs: Not on file  Physical Activity: Not on  file  Stress: Not on file  Social Connections: Not on file  Intimate Partner Violence: Not on file    Review of Systems: See HPI, otherwise negative ROS  Physical Exam: BP 138/76   Pulse (!) 59   Temp (!) 97 F (36.1 C) (Temporal)   Resp 20   Ht 5' 7 (1.702 m)   Wt 63 kg   SpO2 100%   BMI 21.77 kg/m  General:   Alert,  pleasant and cooperative in NAD Head:  Normocephalic and atraumatic. Neck:  Supple; no masses or thyromegaly. Lungs:  Clear throughout to auscultation, normal respiratory effort.    Heart:  +S1, +S2, Regular rate and rhythm, No edema. Abdomen:  Soft, nontender and nondistended. Normal bowel sounds, without guarding, and without rebound.   Neurologic:   Alert and  oriented x4;  grossly normal neurologically.  Impression/Plan: SEMONE ORLOV is here for an endoscopy  to be performed for  evaluation of GERD    Risks, benefits, limitations, and alternatives regarding endoscopy have been reviewed with the patient.  Questions have been answered.  All parties agreeable.   Ruel Kung, MD  10/25/2023, 7:51 AM

## 2023-10-25 NOTE — Anesthesia Preprocedure Evaluation (Signed)
 Anesthesia Evaluation  Patient identified by MRN, date of birth, ID band Patient awake    Reviewed: Allergy & Precautions, H&P , NPO status , Patient's Chart, lab work & pertinent test results, reviewed documented beta blocker date and time   History of Anesthesia Complications (+) history of anesthetic complications  Airway Mallampati: II   Neck ROM: full    Dental  (+) Poor Dentition   Pulmonary neg pulmonary ROS   Pulmonary exam normal        Cardiovascular Exercise Tolerance: Good negative cardio ROS Normal cardiovascular exam Rhythm:regular Rate:Normal     Neuro/Psych negative neurological ROS  negative psych ROS   GI/Hepatic negative GI ROS, Neg liver ROS,,,  Endo/Other  negative endocrine ROS    Renal/GU negative Renal ROS  negative genitourinary   Musculoskeletal   Abdominal   Peds  Hematology negative hematology ROS (+)   Anesthesia Other Findings Past Medical History: 06/24/2022: Basal cell carcinoma     Comment:  L neck inframandibular - ED&C No date: Complication of anesthesia     Comment:  hard to wake up 03/31/2022: Dysplastic nevus     Comment:  Left lat deltoid - Moderate 03/31/2022: Dysplastic nevus     Comment:  Right UQA 8.0 cm lat to umbilicus - Moderate 03/31/2022: Dysplastic nevus     Comment:  Left medial breast - Moderate 01/03/2022: Dysplastic nevus     Comment:  L middle medial thigh - severe, excised 02/02/23 No date: Fibroadenoma of both breasts     Comment:  has had multiple lumpectomies and biopsies of both               breasts 01/05/2022: Melanoma (HCC)     Comment:  left mid lateral pretibial, Melanoma IS, Excised               03/24/22 Past Surgical History: No date: BREAST BIOPSY; Bilateral     Comment:  X2 No date: BREAST SURGERY     Comment:  multiple lumpectomies and biopsies on both breasts 11/22/2020: COLONOSCOPY WITH PROPOFOL ; N/A     Comment:  Procedure:  COLONOSCOPY WITH PROPOFOL ;  Surgeon: Therisa Bi, MD;  [facility];  Service:               Gastroenterology;  Laterality: N/A; 03/05/2016: EXCISION OF BREAST BIOPSY; Right     Comment:  Procedure: EXCISION OF TWO FIBROADENOMAS RIGHT  BREAST ;              Surgeon: Donnice Lima, MD;  Location: [facility];  Service: General;  Laterality: Right; BMI    Body Mass Index: 21.77 kg/m     Reproductive/Obstetrics negative OB ROS                             Anesthesia Physical Anesthesia Plan  ASA: 2  Anesthesia Plan: General   Post-op Pain Management:    Induction:   PONV Risk Score and Plan:   Airway Management Planned:   Additional Equipment:   Intra-op Plan:   Post-operative Plan:   Informed Consent: I have reviewed the patients History and Physical, chart, labs and discussed the procedure including the risks, benefits and alternatives for the proposed anesthesia with the patient or authorized representative who has indicated his/her understanding  and acceptance.     Dental Advisory Given  Plan Discussed with: CRNA  Anesthesia Plan Comments:        Anesthesia Quick Evaluation

## 2023-10-25 NOTE — Anesthesia Postprocedure Evaluation (Signed)
 Anesthesia Post Note  Patient: Shelley Davila  Procedure(s) Performed: ESOPHAGOGASTRODUODENOSCOPY (EGD) WITH PROPOFOL  BIOPSY  Patient location during evaluation: PACU Anesthesia Type: General Level of consciousness: awake and alert Pain management: pain level controlled Vital Signs Assessment: post-procedure vital signs reviewed and stable Respiratory status: spontaneous breathing, nonlabored ventilation, respiratory function stable and patient connected to nasal cannula oxygen Cardiovascular status: blood pressure returned to baseline and stable Postop Assessment: no apparent nausea or vomiting Anesthetic complications: no   There were no known notable events for this encounter.   Last Vitals:  Vitals:   10/25/23 0818 10/25/23 0828  BP: 129/71   Pulse: 60 (!) 55  Resp: 17 17  Temp:    SpO2: 100%     Last Pain:  Vitals:   10/25/23 0828  TempSrc:   PainSc: 0-No pain                 Lynwood KANDICE Clause

## 2023-10-25 NOTE — Transfer of Care (Signed)
 Immediate Anesthesia Transfer of Care Note  Patient: Shelley Davila  Procedure(s) Performed: ESOPHAGOGASTRODUODENOSCOPY (EGD) WITH PROPOFOL  BIOPSY  Patient Location: PACU  Anesthesia Type:General  Level of Consciousness: sedated  Airway & Oxygen Therapy: Patient Spontanous Breathing  Post-op Assessment: Report given to RN and Post -op Vital signs reviewed and stable  Post vital signs: Reviewed and stable  Last Vitals:  Vitals Value Taken Time  BP 103/58 10/25/23 0808  Temp 35.9 C 10/25/23 0808  Pulse 59 10/25/23 0808  Resp 13 10/25/23 0808  SpO2 99 % 10/25/23 0808    Last Pain:  Vitals:   10/25/23 0808  TempSrc: Tympanic  PainSc: Asleep         Complications: There were no known notable events for this encounter.

## 2023-10-25 NOTE — Op Note (Signed)
 [facility] Gastroenterology Patient Name: Shelley Davila Procedure Date: 10/25/2023 7:17 AM MRN: [id number] Account #: [id number] Date of Birth: 1971-03-22 Admit Type: Outpatient Age: 53 Room: [facility] ENDO ROOM 1 Gender: Female Note Status: Finalized Instrument Name: Upper Endoscope [id number] Procedure:             Upper GI endoscopy Indications:           Follow-up of esophageal reflux Providers:             Ruel Kung MD, MD Referring MD:          No Local Md, MD (Referring MD) Medicines:             Monitored Anesthesia Care Complications:         No immediate complications. Procedure:             Pre-Anesthesia Assessment:                        - Prior to the procedure, a History and Physical was                         performed, and patient medications, allergies and                         sensitivities were reviewed. The patient's tolerance                         of previous anesthesia was reviewed.                        - The risks and benefits of the procedure and the                         sedation options and risks were discussed with the                         patient. All questions were answered and informed                         consent was obtained.                        - ASA Grade Assessment: II - A patient with mild                         systemic disease.                        After obtaining informed consent, the endoscope was                         passed under direct vision. Throughout the procedure,                         the patient's blood pressure, pulse, and oxygen                         saturations were monitored continuously. The Endoscope  was introduced through the mouth, and advanced to the                         third part of duodenum. The upper GI endoscopy was                         accomplished with ease. The patient tolerated the                         procedure well. Findings:      The  esophagus was normal.      The examined duodenum was normal.      The entire examined stomach was normal. Biopsies were taken with a cold       forceps for histology.      The cardia and gastric fundus were normal on retroflexion. Impression:            - Normal esophagus.                        - Normal examined duodenum.                        - Normal stomach. Biopsied. Recommendation:        - Await pathology results.                        - Discharge patient to home (with escort).                        - Resume previous diet.                        - Continue present medications.                        - Return to GI clinic as previously scheduled. Procedure Code(s):     --- Professional ---                        [phone], Esophagogastroduodenoscopy, flexible,                         transoral; with biopsy, single or multiple Diagnosis Code(s):     --- Professional ---                        K21.9, Gastro-esophageal reflux disease without                         esophagitis CPT copyright 2022 American Medical Association. All rights reserved. The codes documented in this report are preliminary and upon coder review may  be revised to meet current compliance requirements. Ruel Kung, MD Ruel Kung MD, MD 10/25/2023 8:06:11 AM This report has been signed electronically. Number of Addenda: 0 Note Initiated On: 10/25/2023 7:17 AM Estimated Blood Loss:  Estimated blood loss: none.      [facility]

## 2023-10-26 ENCOUNTER — Encounter: Payer: Self-pay | Admitting: Gastroenterology

## 2023-10-26 LAB — SURGICAL PATHOLOGY

## 2023-11-05 ENCOUNTER — Telehealth: Payer: Self-pay

## 2023-11-05 ENCOUNTER — Encounter: Payer: Self-pay | Admitting: Gastroenterology

## 2023-11-05 NOTE — Telephone Encounter (Signed)
Patient called stating that she recently had an EGD done on 10/25/2023 and she saw online that her pathology report stated that she had gastritis. Patient would like a call back as in what she is needing to do for it if any. Patient currently takes Omeprazole 20 MG 1 capsule daily.

## 2023-11-08 ENCOUNTER — Telehealth: Payer: Self-pay

## 2023-11-08 ENCOUNTER — Other Ambulatory Visit: Payer: Self-pay

## 2023-11-08 MED ORDER — OMEPRAZOLE 40 MG PO CPDR
40.0000 mg | DELAYED_RELEASE_CAPSULE | Freq: Two times a day (BID) | ORAL | 2 refills | Status: AC
  Filled 2023-11-08 – 2023-11-09 (×2): qty 60, 30d supply, fill #0
  Filled 2024-02-02: qty 60, 30d supply, fill #1
  Filled 2024-07-17: qty 60, 30d supply, fill #2

## 2023-11-08 NOTE — Telephone Encounter (Signed)
Left message for patient to call office.  I called patient back and it went to voicemail.  Please Call and notify patient: when Dr. Tobi Bastos performed to the EGD, her esophagus, stomach, and first part of the small intestine look completely normal.  When he did biopsies from the stomach it did show minimal chronic gastritis.  This is a condition where the stomach lining is mildly irritated by certain substances.  Most common substances that cause stomach irritation include NSAIDs such as Advil, ibuprofen, Aleve, goody powders, BC powders, Excedrin.  Alcohol, spicy and acidic foods can also cause stomach irritation.  We recommend avoid all the substances.  She is on omeprazole 20 Mg once daily which treats gastritis.  There is nothing else to do.  Reassurance.  Not worrisome.  She can take Tylenol as needed for pain.  Avoid NSAIDs.

## 2023-11-08 NOTE — Telephone Encounter (Signed)
Spoke with patient -Omeprazole 40 mg BID sent to pharmacy. Per Inetta Fermo.   I called patient back and it went to voicemail.  Please Call and notify patient: when Dr. Tobi Bastos performed to the EGD, her esophagus, stomach, and first part of the small intestine look completely normal.  When he did biopsies from the stomach it did show minimal chronic gastritis.  This is a condition where the stomach lining is mildly irritated by certain substances.  Most common substances that cause stomach irritation include NSAIDs such as Advil, ibuprofen, Aleve, goody powders, BC powders, Excedrin.  Alcohol, spicy and acidic foods can also cause stomach irritation.  We recommend avoid all the substances.  She is on omeprazole 20 Mg once daily which treats gastritis.  There is nothing else to do.  Reassurance.  Not worrisome.  She can take Tylenol as needed for pain.  Avoid NSAIDs.  Celso Amy, PA-C

## 2023-11-09 ENCOUNTER — Other Ambulatory Visit: Payer: Self-pay

## 2023-11-09 ENCOUNTER — Other Ambulatory Visit (HOSPITAL_COMMUNITY): Payer: Self-pay

## 2023-11-25 DIAGNOSIS — Z0000 Encounter for general adult medical examination without abnormal findings: Secondary | ICD-10-CM | POA: Diagnosis not present

## 2023-12-03 DIAGNOSIS — Z0000 Encounter for general adult medical examination without abnormal findings: Secondary | ICD-10-CM | POA: Diagnosis not present

## 2023-12-08 ENCOUNTER — Other Ambulatory Visit: Payer: Self-pay

## 2023-12-08 ENCOUNTER — Other Ambulatory Visit (HOSPITAL_COMMUNITY): Payer: Self-pay

## 2023-12-08 DIAGNOSIS — M25852 Other specified joint disorders, left hip: Secondary | ICD-10-CM | POA: Diagnosis not present

## 2023-12-08 DIAGNOSIS — M25552 Pain in left hip: Secondary | ICD-10-CM | POA: Diagnosis not present

## 2023-12-08 MED ORDER — FAMOTIDINE 40 MG PO TABS
40.0000 mg | ORAL_TABLET | Freq: Every day | ORAL | 1 refills | Status: DC
  Filled 2023-12-08: qty 30, 30d supply, fill #0

## 2023-12-17 DIAGNOSIS — M533 Sacrococcygeal disorders, not elsewhere classified: Secondary | ICD-10-CM | POA: Diagnosis not present

## 2023-12-17 DIAGNOSIS — G8929 Other chronic pain: Secondary | ICD-10-CM | POA: Diagnosis not present

## 2023-12-24 DIAGNOSIS — G8929 Other chronic pain: Secondary | ICD-10-CM | POA: Diagnosis not present

## 2023-12-24 DIAGNOSIS — M533 Sacrococcygeal disorders, not elsewhere classified: Secondary | ICD-10-CM | POA: Diagnosis not present

## 2023-12-30 ENCOUNTER — Other Ambulatory Visit (HOSPITAL_COMMUNITY): Payer: Self-pay

## 2023-12-31 DIAGNOSIS — G8929 Other chronic pain: Secondary | ICD-10-CM | POA: Diagnosis not present

## 2023-12-31 DIAGNOSIS — M533 Sacrococcygeal disorders, not elsewhere classified: Secondary | ICD-10-CM | POA: Diagnosis not present

## 2024-01-03 ENCOUNTER — Telehealth: Payer: Self-pay

## 2024-01-04 NOTE — Telephone Encounter (Signed)
 Error

## 2024-01-05 ENCOUNTER — Ambulatory Visit: Payer: 59 | Admitting: Dermatology

## 2024-01-05 ENCOUNTER — Encounter: Payer: Self-pay | Admitting: Dermatology

## 2024-01-05 DIAGNOSIS — M799 Soft tissue disorder, unspecified: Secondary | ICD-10-CM | POA: Diagnosis not present

## 2024-01-05 DIAGNOSIS — L821 Other seborrheic keratosis: Secondary | ICD-10-CM | POA: Diagnosis not present

## 2024-01-05 DIAGNOSIS — L573 Poikiloderma of Civatte: Secondary | ICD-10-CM

## 2024-01-05 DIAGNOSIS — D1801 Hemangioma of skin and subcutaneous tissue: Secondary | ICD-10-CM | POA: Diagnosis not present

## 2024-01-05 DIAGNOSIS — Z1283 Encounter for screening for malignant neoplasm of skin: Secondary | ICD-10-CM | POA: Diagnosis not present

## 2024-01-05 DIAGNOSIS — L578 Other skin changes due to chronic exposure to nonionizing radiation: Secondary | ICD-10-CM | POA: Diagnosis not present

## 2024-01-05 DIAGNOSIS — L814 Other melanin hyperpigmentation: Secondary | ICD-10-CM

## 2024-01-05 DIAGNOSIS — Z85820 Personal history of malignant melanoma of skin: Secondary | ICD-10-CM

## 2024-01-05 DIAGNOSIS — Z86018 Personal history of other benign neoplasm: Secondary | ICD-10-CM

## 2024-01-05 DIAGNOSIS — L7633 Postprocedural seroma of skin and subcutaneous tissue following a dermatologic procedure: Secondary | ICD-10-CM

## 2024-01-05 DIAGNOSIS — Z85828 Personal history of other malignant neoplasm of skin: Secondary | ICD-10-CM | POA: Diagnosis not present

## 2024-01-05 DIAGNOSIS — W908XXA Exposure to other nonionizing radiation, initial encounter: Secondary | ICD-10-CM

## 2024-01-05 DIAGNOSIS — D229 Melanocytic nevi, unspecified: Secondary | ICD-10-CM

## 2024-01-05 NOTE — Patient Instructions (Signed)

## 2024-01-05 NOTE — Progress Notes (Signed)
 Follow-Up Visit   Subjective  Shelley Davila is a 53 y.o. female who presents for the following: Skin Cancer Screening and Full Body Skin Exam Hx dysplastic, hx of bcc, hx of melanoma Hx of melanoma in situ The patient presents for Total-Body Skin Exam (TBSE) for skin cancer screening and mole check. The patient has spots, moles and lesions to be evaluated, some may be new or changing and the patient may have concern these could be cancer.  The following portions of the chart were reviewed this encounter and updated as appropriate: medications, allergies, medical history  Review of Systems:  No other skin or systemic complaints except as noted in HPI or Assessment and Plan.  Objective  Well appearing patient in no apparent distress; mood and affect are within normal limits.  A full examination was performed including scalp, head, eyes, ears, nose, lips, neck, chest, axillae, abdomen, back, buttocks, bilateral upper extremities, bilateral lower extremities, hands, feet, fingers, toes, fingernails, and toenails. All findings within normal limits unless otherwise noted below.   Relevant physical exam findings are noted in the Assessment and Plan. Neck - Anterior Dyschromia and telangiectasias  Assessment & Plan   SKIN CANCER SCREENING PERFORMED TODAY.  ACTINIC DAMAGE - Chronic condition, secondary to cumulative UV/sun exposure - diffuse scaly erythematous macules with underlying dyspigmentation - Recommend daily broad spectrum sunscreen SPF 30+ to sun-exposed areas, reapply every 2 hours as needed.  - Staying in the shade or wearing long sleeves, sun glasses (UVA+UVB protection) and wide brim hats (4-inch brim around the entire circumference of the hat) are also recommended for sun protection.  - Call for new or changing lesions.  LENTIGINES, SEBORRHEIC KERATOSES, HEMANGIOMAS - Benign normal skin lesions - Benign-appearing - Call for any changes  MELANOCYTIC NEVI - Tan-brown  and/or pink-flesh-colored symmetric macules and papules - Benign appearing on exam today - Observation - Call clinic for new or changing moles - Recommend daily use of broad spectrum spf 30+ sunscreen to sun-exposed areas.   HISTORY OF BASAL CELL CARCINOMA OF THE SKIN - No evidence of recurrence today - Recommend regular full body skin exams - Recommend daily broad spectrum sunscreen SPF 30+ to sun-exposed areas, reapply every 2 hours as needed.  - Call if any new or changing lesions are noted between office visits   HISTORY OF MELANOMA - No evidence of recurrence today - No lymphadenopathy - Recommend regular full body skin exams - Recommend daily broad spectrum sunscreen SPF 30+ to sun-exposed areas, reapply every 2 hours as needed.  - Call if any new or changing lesions are noted between office visits   HISTORY OF DYSPLASTIC NEVUS No evidence of recurrence today Recommend regular full body skin exams Recommend daily broad spectrum sunscreen SPF 30+ to sun-exposed areas, reapply every 2 hours as needed.  Call if any new or changing lesions are noted between office visits   Possible Seroma  Exam: Subcutaneous nodule at L middle med thigh at severely dysplastic nevus site, 3.0 cm, confirmed by U/S, images scanned into chart.  3 x 3 cm -- No change since last visit Recheck in next follow up will consider draining    Benign-appearing. Discussed aspiration if bothersome, but not recommended at this time. Will likely resolve on its own over time.  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  IAsher Muir, CMA, am acting as scribe for Armida Sans, MD.   Documentation: I have reviewed the above documentation for accuracy and completeness, and I agree with the above.  Armida Sans, MD

## 2024-01-07 DIAGNOSIS — M533 Sacrococcygeal disorders, not elsewhere classified: Secondary | ICD-10-CM | POA: Diagnosis not present

## 2024-01-07 DIAGNOSIS — G8929 Other chronic pain: Secondary | ICD-10-CM | POA: Diagnosis not present

## 2024-01-21 DIAGNOSIS — G8929 Other chronic pain: Secondary | ICD-10-CM | POA: Diagnosis not present

## 2024-01-21 DIAGNOSIS — M533 Sacrococcygeal disorders, not elsewhere classified: Secondary | ICD-10-CM | POA: Diagnosis not present

## 2024-02-02 ENCOUNTER — Other Ambulatory Visit (HOSPITAL_COMMUNITY): Payer: Self-pay

## 2024-02-18 DIAGNOSIS — G8929 Other chronic pain: Secondary | ICD-10-CM | POA: Diagnosis not present

## 2024-02-18 DIAGNOSIS — M533 Sacrococcygeal disorders, not elsewhere classified: Secondary | ICD-10-CM | POA: Diagnosis not present

## 2024-02-23 ENCOUNTER — Other Ambulatory Visit (HOSPITAL_COMMUNITY): Payer: Self-pay

## 2024-02-23 DIAGNOSIS — M25552 Pain in left hip: Secondary | ICD-10-CM | POA: Diagnosis not present

## 2024-02-23 MED ORDER — MELOXICAM 15 MG PO TABS
15.0000 mg | ORAL_TABLET | Freq: Every day | ORAL | 1 refills | Status: DC
  Filled 2024-02-23: qty 30, 30d supply, fill #0

## 2024-03-10 ENCOUNTER — Other Ambulatory Visit (HOSPITAL_COMMUNITY): Payer: Self-pay

## 2024-03-10 ENCOUNTER — Ambulatory Visit (INDEPENDENT_AMBULATORY_CARE_PROVIDER_SITE_OTHER): Admitting: Certified Nurse Midwife

## 2024-03-10 VITALS — BP 146/66 | HR 67 | Ht 67.0 in | Wt 138.7 lb

## 2024-03-10 DIAGNOSIS — Z1231 Encounter for screening mammogram for malignant neoplasm of breast: Secondary | ICD-10-CM

## 2024-03-10 DIAGNOSIS — Z01419 Encounter for gynecological examination (general) (routine) without abnormal findings: Secondary | ICD-10-CM

## 2024-03-10 MED ORDER — NORETHINDRONE-ETH ESTRADIOL 1-5 MG-MCG PO TABS
1.0000 | ORAL_TABLET | Freq: Every day | ORAL | 3 refills | Status: DC
  Filled 2024-03-10: qty 84, 84d supply, fill #0
  Filled 2024-06-13: qty 84, 84d supply, fill #1

## 2024-03-10 NOTE — Progress Notes (Signed)
 ANNUAL EXAM Patient name: Shelley Davila MRN [id number]  Date of birth: [birth year] Chief Complaint:   Annual Exam  History of Present Illness:   Shelley Davila is a 53 y.o. G20P2002 Caucasian female being seen today for a routine annual exam.  Current complaints: night sweats, dry mucous membranes, spotting despite continuous COC. Would like to know about other options for HRT besides current birth control pill.  Exercises regularly. Denies nicotine use.  No LMP recorded.   The pregnancy intention screening data noted above was reviewed. Potential methods of contraception were discussed. The patient elected to proceed with No data recorded.  Past Medical History:  Diagnosis Date   Basal cell carcinoma 06/24/2022   L neck inframandibular - ED&C   Complication of anesthesia    hard to wake up   Dysplastic nevus 03/31/2022   Left lat deltoid - Moderate   Dysplastic nevus 03/31/2022   Right UQA 8.0 cm lat to umbilicus - Moderate   Dysplastic nevus 03/31/2022   Left medial breast - Moderate   Dysplastic nevus 01/03/2022   L middle medial thigh - severe, excised 02/02/23   Fibroadenoma of both breasts    has had multiple lumpectomies and biopsies of both breasts   Melanoma (HCC) 01/05/2022   left mid lateral pretibial, Melanoma IS, Excised 03/24/22   Family History  Problem Relation Age of Onset   COPD Mother    Hypercholesterolemia Mother    Arthritis Mother    Arthritis Father    Breast cancer Neg Hx        Component Value Date/Time   DIAGPAP  12/26/2021 1514    - Negative for intraepithelial lesion or malignancy (NILM)   HPVHIGH Negative 12/26/2021 1514   ADEQPAP  12/26/2021 1514    Satisfactory for evaluation; transformation zone component PRESENT.       Last pap 2023. H/O abnormal pap: no Last mammogram: 10/24. Results were: normal. Family h/o breast cancer: no Last colonoscopy: 2022. Results were: normal. Family h/o colorectal cancer: no     03/10/2024    1:26  PM  Depression screen PHQ 2/9  Decreased Interest 0  Down, Depressed, Hopeless 0  PHQ - 2 Score 0         No data to display           Review of Systems:   Pertinent items are noted in HPI Denies any headaches, blurred vision, fatigue, shortness of breath, chest pain, abdominal pain, abnormal vaginal discharge/itching/odor/irritation, problems with periods, bowel movements, urination, or intercourse unless otherwise stated above. Pertinent History Reviewed:  Reviewed past medical,surgical, social and family history.  Reviewed problem list, medications and allergies. Physical Assessment:   Vitals:   03/10/24 1320  BP: (!) 146/66  Pulse: 67  Weight: 138 lb 11.2 oz (62.9 kg)  Height: 5\' 7"  (1.702 m)  Body mass index is 21.72 kg/m.       Physical Exam Vitals reviewed.  Constitutional:      General: She is not in acute distress.    Appearance: Normal appearance.  HENT:     Head: Normocephalic.  Neck:     Thyroid: No thyroid mass or thyromegaly.  Cardiovascular:     Rate and Rhythm: Normal rate and regular rhythm.     Heart sounds: Normal heart sounds.  Pulmonary:     Effort: Pulmonary effort is normal.     Breath sounds: Normal breath sounds.  Chest:  Breasts:    Tanner Score is 5.  Right: Normal.     Left: Normal.  Abdominal:     General: Abdomen is flat.     Palpations: Abdomen is soft.     Tenderness: There is no abdominal tenderness.  Musculoskeletal:     Cervical back: Neck supple. No tenderness.  Skin:    General: Skin is warm and dry.  Neurological:     General: No focal deficit present.     Mental Status: She is alert and oriented to person, place, and time.  Psychiatric:        Mood and Affect: Mood normal.        Behavior: Behavior normal.      No results found for this or any previous visit (from the past 24 hours).  Assessment & Plan:  1. Well woman exam (Primary)  2. Breast cancer screening by mammogram   Mammogram: October 2025,  or sooner if problems Colonoscopy: per GI, or sooner if problems  No orders of the defined types were placed in this encounter. Elevated blood pressure today, no history of hypertension, will recheck at work (works at [employer] [facility]) if remains elevated recommend follow up with PCP. Discussed that HRT may help with symptoms given higher dose of estrogen and need for progestin for endometrial protection. However, if BP remains elevated would need to control BP prior to adding HRT due to concerns for worsening HTN, stroke & DVT. Will message with BP readings.  Meds:  Meds ordered this encounter  Medications   norethindrone -ethinyl estradiol  (FEMHRT  1/5) 1-5 MG-MCG TABS tablet    Sig: Take 1 tablet by mouth daily.    Dispense:  90 tablet    Refill:  3    Follow-up: Return in 1 year (on 03/10/2025) for Annual exam.  Forestine Igo, CNM 03/10/2024 2:14 PM

## 2024-03-10 NOTE — Patient Instructions (Signed)

## 2024-03-14 ENCOUNTER — Encounter: Payer: Self-pay | Admitting: Certified Nurse Midwife

## 2024-03-31 ENCOUNTER — Other Ambulatory Visit (HOSPITAL_COMMUNITY): Payer: Self-pay

## 2024-03-31 ENCOUNTER — Other Ambulatory Visit: Payer: Self-pay

## 2024-03-31 DIAGNOSIS — K219 Gastro-esophageal reflux disease without esophagitis: Secondary | ICD-10-CM | POA: Diagnosis not present

## 2024-03-31 MED ORDER — CHLORTHALIDONE 25 MG PO TABS
25.0000 mg | ORAL_TABLET | Freq: Every day | ORAL | 3 refills | Status: DC
  Filled 2024-03-31: qty 90, 90d supply, fill #0

## 2024-03-31 MED ORDER — OMEPRAZOLE 40 MG PO CPDR
DELAYED_RELEASE_CAPSULE | ORAL | 3 refills | Status: DC
  Filled 2024-03-31: qty 90, 90d supply, fill #0

## 2024-04-20 ENCOUNTER — Other Ambulatory Visit: Payer: Self-pay

## 2024-04-20 ENCOUNTER — Other Ambulatory Visit (HOSPITAL_COMMUNITY): Payer: Self-pay

## 2024-04-20 ENCOUNTER — Ambulatory Visit: Payer: 59 | Admitting: Dermatology

## 2024-04-20 MED ORDER — LOSARTAN POTASSIUM-HCTZ 50-12.5 MG PO TABS
1.0000 | ORAL_TABLET | Freq: Every day | ORAL | 11 refills | Status: AC
  Filled 2024-04-20: qty 30, 30d supply, fill #0
  Filled 2024-05-25: qty 30, 30d supply, fill #1
  Filled 2024-07-17: qty 30, 30d supply, fill #2
  Filled 2024-09-03: qty 30, 30d supply, fill #3
  Filled 2024-10-16: qty 30, 30d supply, fill #4

## 2024-04-27 DIAGNOSIS — R002 Palpitations: Secondary | ICD-10-CM | POA: Diagnosis not present

## 2024-04-27 DIAGNOSIS — I959 Hypotension, unspecified: Secondary | ICD-10-CM | POA: Diagnosis not present

## 2024-04-27 DIAGNOSIS — R253 Fasciculation: Secondary | ICD-10-CM | POA: Diagnosis not present

## 2024-04-29 ENCOUNTER — Telehealth: Admitting: Family

## 2024-04-29 DIAGNOSIS — R399 Unspecified symptoms and signs involving the genitourinary system: Secondary | ICD-10-CM

## 2024-04-30 MED ORDER — CEPHALEXIN 500 MG PO CAPS: 500.0000 mg | ORAL_CAPSULE | Freq: Two times a day (BID) | ORAL | 0 refills | Status: DC

## 2024-04-30 NOTE — Progress Notes (Signed)

## 2024-05-12 DIAGNOSIS — R002 Palpitations: Secondary | ICD-10-CM | POA: Diagnosis not present

## 2024-05-25 ENCOUNTER — Other Ambulatory Visit (HOSPITAL_COMMUNITY): Payer: Self-pay

## 2024-06-13 ENCOUNTER — Other Ambulatory Visit: Payer: Self-pay | Admitting: Medical Genetics

## 2024-06-13 ENCOUNTER — Other Ambulatory Visit (HOSPITAL_COMMUNITY): Payer: Self-pay

## 2024-06-23 ENCOUNTER — Other Ambulatory Visit (HOSPITAL_COMMUNITY)
Admission: RE | Admit: 2024-06-23 | Discharge: 2024-06-23 | Disposition: A | Discharge status: Home / Self Care | Payer: Self-pay | Source: Ambulatory Visit | Attending: Oncology | Admitting: Oncology

## 2024-07-03 LAB — GENECONNECT MOLECULAR SCREEN: Genetic Analysis Overall Interpretation: NEGATIVE

## 2024-07-04 ENCOUNTER — Telehealth: Admitting: Physician Assistant

## 2024-07-04 DIAGNOSIS — R3989 Other symptoms and signs involving the genitourinary system: Secondary | ICD-10-CM | POA: Diagnosis not present

## 2024-07-04 MED ORDER — NITROFURANTOIN MONOHYD MACRO 100 MG PO CAPS: 100.0000 mg | ORAL_CAPSULE | Freq: Two times a day (BID) | ORAL | 0 refills | Status: DC

## 2024-07-04 NOTE — Progress Notes (Signed)

## 2024-07-04 NOTE — Progress Notes (Signed)
 I have spent 5 minutes in review of e-visit questionnaire, review and updating patient chart, medical decision making and response to patient.   Elsie Velma Lunger, PA-C

## 2024-07-07 ENCOUNTER — Other Ambulatory Visit: Payer: Self-pay | Admitting: Internal Medicine

## 2024-07-07 DIAGNOSIS — Z1231 Encounter for screening mammogram for malignant neoplasm of breast: Secondary | ICD-10-CM

## 2024-07-10 ENCOUNTER — Ambulatory Visit: Admitting: Dermatology

## 2024-07-10 ENCOUNTER — Encounter: Payer: Self-pay | Admitting: Dermatology

## 2024-07-10 DIAGNOSIS — D1801 Hemangioma of skin and subcutaneous tissue: Secondary | ICD-10-CM

## 2024-07-10 DIAGNOSIS — L821 Other seborrheic keratosis: Secondary | ICD-10-CM

## 2024-07-10 DIAGNOSIS — Z85828 Personal history of other malignant neoplasm of skin: Secondary | ICD-10-CM

## 2024-07-10 DIAGNOSIS — W908XXA Exposure to other nonionizing radiation, initial encounter: Secondary | ICD-10-CM

## 2024-07-10 DIAGNOSIS — Z86018 Personal history of other benign neoplasm: Secondary | ICD-10-CM

## 2024-07-10 DIAGNOSIS — L814 Other melanin hyperpigmentation: Secondary | ICD-10-CM | POA: Diagnosis not present

## 2024-07-10 DIAGNOSIS — L578 Other skin changes due to chronic exposure to nonionizing radiation: Secondary | ICD-10-CM

## 2024-07-10 DIAGNOSIS — Z86006 Personal history of melanoma in-situ: Secondary | ICD-10-CM

## 2024-07-10 DIAGNOSIS — Z1283 Encounter for screening for malignant neoplasm of skin: Secondary | ICD-10-CM

## 2024-07-10 DIAGNOSIS — L7633 Postprocedural seroma of skin and subcutaneous tissue following a dermatologic procedure: Secondary | ICD-10-CM

## 2024-07-10 DIAGNOSIS — Z7189 Other specified counseling: Secondary | ICD-10-CM

## 2024-07-10 DIAGNOSIS — Z85820 Personal history of malignant melanoma of skin: Secondary | ICD-10-CM

## 2024-07-10 NOTE — Progress Notes (Unsigned)
 Follow-Up Visit   Subjective  Shelley Davila is a 53 y.o. female who presents for the following: Skin Cancer Screening and Full Body Skin Exam The patient presents for Total-Body Skin Exam (TBSE) for skin cancer screening and mole check. The patient has spots, moles and lesions to be evaluated, some may be new or changing and the patient may have concern these could be cancer.  The following portions of the chart were reviewed this encounter and updated as appropriate: medications, allergies, medical history  Review of Systems:  No other skin or systemic complaints except as noted in HPI or Assessment and Plan.  Objective  Well appearing patient in no apparent distress; mood and affect are within normal limits.  A full examination was performed including scalp, head, eyes, ears, nose, lips, neck, chest, axillae, abdomen, back, buttocks, bilateral upper extremities, bilateral lower extremities, hands, feet, fingers, toes, fingernails, and toenails. All findings within normal limits unless otherwise noted below.   Relevant physical exam findings are noted in the Assessment and Plan.  R ant thigh above the knee       Assessment & Plan   SKIN CANCER SCREENING PERFORMED TODAY.  ACTINIC DAMAGE - Chronic condition, secondary to cumulative UV/sun exposure - diffuse scaly erythematous macules with underlying dyspigmentation - Recommend daily broad spectrum sunscreen SPF 30+ to sun-exposed areas, reapply every 2 hours as needed.  - Staying in the shade or wearing long sleeves, sun glasses (UVA+UVB protection) and wide brim hats (4-inch brim around the entire circumference of the hat) are also recommended for sun protection.  - Call for new or changing lesions.  LENTIGINES, SEBORRHEIC KERATOSES, HEMANGIOMAS - Benign normal skin lesions - Benign-appearing - Call for any changes  MELANOCYTIC NEVI - Tan-brown and/or pink-flesh-colored symmetric macules and papules - Benign appearing on  exam today - Observation - Call clinic for new or changing moles - Recommend daily use of broad spectrum spf 30+ sunscreen to sun-exposed areas.   HISTORY OF MELANOMA IN SITU - No evidence of recurrence today - Recommend regular full body skin exams - Recommend daily broad spectrum sunscreen SPF 30+ to sun-exposed areas, reapply every 2 hours as needed.  - Call if any new or changing lesions are noted between office visits  - L mid lat pretibial, excised 03/24/2022  HISTORY OF BASAL CELL CARCINOMA OF THE SKIN - No evidence of recurrence today - Recommend regular full body skin exams - Recommend daily broad spectrum sunscreen SPF 30+ to sun-exposed areas, reapply every 2 hours as needed.  - Call if any new or changing lesions are noted between office visits  - L neck inframandibular  HISTORY OF DYSPLASTIC NEVUS No evidence of recurrence today Recommend regular full body skin exams Recommend daily broad spectrum sunscreen SPF 30+ to sun-exposed areas, reapply every 2 hours as needed.  Call if any new or changing lesions are noted between office visits  -L lat deltoid, RUQA 8.0cm lat to umbilicus, L medial breast, L middle medial thigh  SEBORRHEIC KERATOSIS R ant thigh above the knee Exam: 0.5 x 0.3cm waxy macule Treatment Plan: Benign-appearing.  Observation.  Call clinic for new or changing moles.  Recommend daily use of broad spectrum spf 30+ sunscreen to sun-exposed areas.   SEROMA L middle med thigh After Dysplastic nevus excision Much improved - smaller recently Exam: 0.8cm sq nodule Treatment Plan: Benign-appearing.  Observation.  Call clinic for new or changing moles.  Recommend daily use of broad spectrum spf 30+ sunscreen to  sun-exposed areas.   HEMANGIOMAS Trunk, extremities Exam: red papule(s) Discussed benign nature. Recommend observation. Call for changes. Counseling for BBL / IPL / Laser and Coordination of Care Discussed the treatment option of Broad Band  Light (BBL) /Intense Pulsed Light (IPL)/ Laser for skin discoloration, including brown spots and redness.  Typically we recommend at least 1-3 treatment sessions about 5-8 weeks apart for best results.  Cannot have tanned skin when BBL performed, and regular use of sunscreen/photoprotection is advised after the procedure to help maintain results. The patient's condition may also require maintenance treatments in the future.  The fee for BBL / laser treatments is $350 per treatment session for the whole face.  A fee can be quoted for other parts of the body.  Insurance typically does not pay for BBL/laser treatments and therefore the fee is an out-of-pocket cost. Recommend prophylactic valtrex treatment. Once scheduled for procedure, will send Rx in prior to patient's appointment.    Return in about 6 months (around 01/07/2025) for TBSE, Hx of Melanoma IS, Hx of BCC, Hx of Dysplastic nevi, can schedule for BBL for hemangiomas.  I, Grayce Saunas, RMA, am acting as scribe for Alm Rhyme, MD .   Documentation: I have reviewed the above documentation for accuracy and completeness, and I agree with the above.  Alm Rhyme, MD

## 2024-07-10 NOTE — Patient Instructions (Addendum)
 Hemangiomas trunk, arms, legs, face Counseling for BBL / IPL / Laser and Coordination of Care Discussed the treatment option of Broad Band Light (BBL) /Intense Pulsed Light (IPL)/ Laser for skin discoloration, including brown spots and redness.  Typically we recommend at least 1-3 treatment sessions about 5-8 weeks apart for best results.  Cannot have tanned skin when BBL performed, and regular use of sunscreen/photoprotection is advised after the procedure to help maintain results. The patient's condition may also require maintenance treatments in the future.  The fee for BBL / laser treatments is $350 per treatment session for the whole face.  A fee can be quoted for other parts of the body.  Insurance typically does not pay for BBL/laser treatments and therefore the fee is an out-of-pocket cost. Recommend prophylactic valtrex treatment. Once scheduled for procedure, will send Rx in prior to patient's appointment.    Seborrheic Keratosis  What causes seborrheic keratoses? Seborrheic keratoses are harmless, common skin growths that first appear during adult life.  As time goes by, more growths appear.  Some people may develop a large number of them.  Seborrheic keratoses appear on both covered and uncovered body parts.  They are not caused by sunlight.  The tendency to develop seborrheic keratoses can be inherited.  They vary in color from skin-colored to gray, brown, or even black.  They can be either smooth or have a rough, warty surface.   Seborrheic keratoses are superficial and look as if they were stuck on the skin.  Under the microscope this type of keratosis looks like layers upon layers of skin.  That is why at times the top layer may seem to fall off, but the rest of the growth remains and re-grows.    Treatment Seborrheic keratoses do not need to be treated, but can easily be removed in the office.  Seborrheic keratoses often cause symptoms when they rub on clothing or jewelry.  Lesions can  be in the way of shaving.  If they become inflamed, they can cause itching, soreness, or burning.  Removal of a seborrheic keratosis can be accomplished by freezing, burning, or surgery. If any spot bleeds, scabs, or grows rapidly, please return to have it checked, as these can be an indication of a skin cancer.  Due to recent changes in healthcare laws, you may see results of your pathology and/or laboratory studies on MyChart before the doctors have had a chance to review them. We understand that in some cases there may be results that are confusing or concerning to you. Please understand that not all results are received at the same time and often the doctors may need to interpret multiple results in order to provide you with the best plan of care or course of treatment. Therefore, we ask that you please give us  2 business days to thoroughly review all your results before contacting the office for clarification. Should we see a critical lab result, you will be contacted sooner.   If You Need Anything After Your Visit  If you have any questions or concerns for your doctor, please call our main line at [phone] and press option 4 to reach your doctor's medical assistant. If no one answers, please leave a voicemail as directed and we will return your call as soon as possible. Messages left after 4 pm will be answered the following business day.   You may also send us  a message via MyChart. We typically respond to MyChart messages within 1-2 business days.  For prescription refills, please ask your pharmacy to contact our office. Our fax number is [phone].  If you have an urgent issue when the clinic is closed that cannot wait until the next business day, you can page your doctor at the number below.    Please note that while we do our best to be available for urgent issues outside of office hours, we are not available 24/7.   If you have an urgent issue and are unable to reach us , you may  choose to seek medical care at your doctor's office, retail clinic, urgent care center, or emergency room.  If you have a medical emergency, please immediately call 911 or go to the emergency department.  Pager Numbers  - Dr. Hester: [phone]  - Dr. Jackquline: [phone]  - Dr. Claudene: [phone]   - Dr. Raymund: [phone]  In the event of inclement weather, please call our main line at [phone] for an update on the status of any delays or closures.  Dermatology Medication Tips: Please keep the boxes that topical medications come in in order to help keep track of the instructions about where and how to use these. Pharmacies typically print the medication instructions only on the boxes and not directly on the medication tubes.   If your medication is too expensive, please contact our office at [phone] option 4 or send us  a message through MyChart.   We are unable to tell what your co-pay for medications will be in advance as this is different depending on your insurance coverage. However, we may be able to find a substitute medication at lower cost or fill out paperwork to get insurance to cover a needed medication.   If a prior authorization is required to get your medication covered by your insurance company, please allow us  1-2 business days to complete this process.  Drug prices often vary depending on where the prescription is filled and some pharmacies may offer cheaper prices.  The website www.goodrx.com contains coupons for medications through different pharmacies. The prices here do not account for what the cost may be with help from insurance (it may be cheaper with your insurance), but the website can give you the price if you did not use any insurance.  - You can print the associated coupon and take it with your prescription to the pharmacy.  - You may also stop by our office during regular business hours and pick up a GoodRx coupon card.  - If you need your  prescription sent electronically to a different pharmacy, notify our office through [facility] or by phone at [phone] option 4.     Si Usted Necesita Algo Despus de Su Visita  Tambin puede enviarnos un mensaje a travs de Clinical cytogeneticist. Por lo general respondemos a los mensajes de MyChart en el transcurso de 1 a 2 das hbiles.  Para renovar recetas, por favor pida a su farmacia que se ponga en contacto con nuestra oficina. Randi lakes de fax es [place] [phone].  Si tiene un asunto urgente cuando la clnica est cerrada y que no puede esperar hasta el siguiente da hbil, puede llamar/localizar a su doctor(a) al nmero que aparece a continuacin.   Por favor, tenga en cuenta que aunque hacemos todo lo posible para estar disponibles para asuntos urgentes fuera del horario de [place], no estamos disponibles las 24 horas del da, los 7 [address]  [address] de la [place].   Si tiene un problema urgente y no puede comunicarse con nosotros, puede optar  por buscar atencin mdica  en el consultorio de su doctor(a), en una clnica privada, en un centro de atencin urgente o en una sala de emergencias.  Si tiene Engineer, drilling, por favor llame inmediatamente al 911 o vaya a la sala de emergencias.  Nmeros de bper  - Dr. Hester: [phone]  - Dra. Jackquline: [phone]  - Dr. Claudene: [phone]  - Dra. Kitts: [phone]  En caso de inclemencias del [place], por favor llame a nuestra lnea principal al [phone] para una actualizacin sobre el estado de cualquier retraso o cierre.  Consejos para la medicacin en dermatologa: Por favor, guarde las cajas en las que vienen los medicamentos de uso tpico para ayudarle a seguir las instrucciones sobre dnde y cmo usarlos. Las farmacias generalmente imprimen las instrucciones del medicamento slo en las cajas y no directamente en los tubos del [place].   Si su medicamento es muy caro, por favor, pngase en contacto con landry rieger llamando al [phone] y presione la opcin 4 o envenos un mensaje a travs de Clinical cytogeneticist.   No podemos decirle cul ser su copago por los medicamentos por adelantado ya que esto es diferente dependiendo de la cobertura de su seguro. Sin embargo, es posible que podamos encontrar un medicamento sustituto a Audiological scientist un formulario para que el seguro cubra el medicamento que se considera necesario.   Si se requiere una autorizacin previa para que su compaa de seguros malta su medicamento, por favor permtanos de 1 a 2 das hbiles para completar este proceso.  Los precios de los medicamentos varan con frecuencia dependiendo del Environmental consultant de dnde se surte la receta y alguna farmacias pueden ofrecer precios ms baratos.  El sitio web www.goodrx.com tiene cupones para medicamentos de Health and safety inspector. Los precios aqu no tienen en cuenta lo que podra costar con la ayuda del seguro (puede ser ms barato con su seguro), pero el sitio web puede darle el precio si no utiliz Tourist information centre manager.  - Puede imprimir el cupn correspondiente y llevarlo con su receta a la farmacia.  - Tambin puede pasar por nuestra oficina durante el horario de atencin regular y Education officer, museum una tarjeta de cupones de GoodRx.  - Si necesita que su receta se enve electrnicamente a una farmacia diferente, informe a nuestra oficina a travs de MyChart de [place] o por telfono llamando al [phone] y presione la opcin 4.

## 2024-07-12 ENCOUNTER — Encounter: Payer: Self-pay | Admitting: Obstetrics & Gynecology

## 2024-07-12 ENCOUNTER — Encounter: Payer: Self-pay | Admitting: Dermatology

## 2024-07-12 ENCOUNTER — Other Ambulatory Visit: Payer: Self-pay

## 2024-07-12 ENCOUNTER — Ambulatory Visit: Admitting: Obstetrics & Gynecology

## 2024-07-12 ENCOUNTER — Other Ambulatory Visit (HOSPITAL_COMMUNITY): Payer: Self-pay

## 2024-07-12 VITALS — BP 145/76 | HR 54 | Ht 67.0 in | Wt 139.0 lb

## 2024-07-12 DIAGNOSIS — Z87440 Personal history of urinary (tract) infections: Secondary | ICD-10-CM

## 2024-07-12 DIAGNOSIS — N951 Menopausal and female climacteric states: Secondary | ICD-10-CM

## 2024-07-12 DIAGNOSIS — Z9189 Other specified personal risk factors, not elsewhere classified: Secondary | ICD-10-CM

## 2024-07-12 LAB — POCT URINALYSIS DIPSTICK
Bilirubin, UA: NEGATIVE
Blood, UA: NEGATIVE
Glucose, UA: NEGATIVE
Ketones, UA: NEGATIVE
Leukocytes, UA: NEGATIVE
Nitrite, UA: NEGATIVE
Protein, UA: NEGATIVE
Spec Grav, UA: <=1.005 — AB (ref 1.010–1.025)
Urobilinogen, UA: 0.2 U/dL
pH, UA: 6.5 (ref 5.0–8.0)

## 2024-07-12 MED ORDER — ESTRADIOL 0.1 MG/GM VA CREA
TOPICAL_CREAM | VAGINAL | 12 refills | Status: DC
  Filled 2024-07-12: qty 42.5, 90d supply, fill #0

## 2024-07-12 MED ORDER — VEOZAH 45 MG PO TABS
1.0000 | ORAL_TABLET | Freq: Every day | ORAL | 5 refills | Status: DC
  Filled 2024-07-12: qty 90, 90d supply, fill #0

## 2024-07-12 NOTE — Progress Notes (Signed)
    GYNECOLOGY PROGRESS NOTE  Subjective:    Patient ID: Shelley Davila, female    DOB: 07-29-1971, 53 y.o.   MRN: [id number]  HPI  Patient is a 53 y.o. married  G2P2002 (45 and 27 yo kids) here to discuss menopause sympotoms and new onset HTN. She saw her Primary care provider a few months ago and was started on losarten and hydrochlorothiazide  for HTN.  Her menopause symptoms are hot flashes, night sweats. Initially these symptoms were better with HRT but the symptoms are getting worse again. She also reports water retention and UTIs. She is sexually active, showers before and voids after.  The following portions of the patient's history were reviewed and updated as appropriate: allergies, current medications, past family history, past medical history, past social history, past surgical history, and problem list.  Review of Systems Pertinent items are noted in HPI.   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 She checks her BP at home also.  Objective:   Blood pressure (!) 148/75, pulse (!) 56, height 5' 7 (1.702 m), weight 139 lb (63 kg). Body mass index is 21.77 kg/m. Well nourished, well hydrated White female, no apparent distress She is ambulating and conversing normally.     Assessment:   Recurrent UTI since menopause- add estrace  cream PV 2 nights per week and rec D mannose daily  HTN and menopause symptoms- I stressed that I may not be able to fix all of her symptoms. Rec'd that she decide if HTN management or meno sx management is priority.  My suggestion is trial of Veozoh and she is willing to try this. Normal LFTs last month. LFTs ordered for 3 months.  Plan:  As above

## 2024-07-12 NOTE — Addendum Note (Signed)
 Addended by: Sharlize Hoar on: 07/12/2024 08:59 AM   Modules accepted: Orders

## 2024-07-17 ENCOUNTER — Other Ambulatory Visit (HOSPITAL_COMMUNITY): Payer: Self-pay

## 2024-08-09 ENCOUNTER — Other Ambulatory Visit (HOSPITAL_COMMUNITY): Payer: Self-pay

## 2024-08-09 ENCOUNTER — Other Ambulatory Visit: Payer: Self-pay

## 2024-08-09 ENCOUNTER — Other Ambulatory Visit: Payer: Self-pay | Admitting: Obstetrics & Gynecology

## 2024-08-09 ENCOUNTER — Telehealth (HOSPITAL_COMMUNITY): Payer: Self-pay

## 2024-08-09 MED ORDER — ESTRADIOL-LEVONORGESTREL 0.045-0.015 MG/DAY TD PTWK
1.0000 | MEDICATED_PATCH | TRANSDERMAL | 12 refills | Status: AC
  Filled 2024-08-09: qty 4, 28d supply, fill #0

## 2024-08-09 NOTE — Progress Notes (Signed)
 Climara  pro prescribed as patient wants to try patches for HRT.

## 2024-08-09 NOTE — Telephone Encounter (Signed)
 Pharmacy Patient Advocate Encounter   Received notification from Pt Calls Messages that prior authorization for Climara  Pro 0.045-0.015 patches is required/requested.   Insurance verification completed.   The patient is insured through [facility].   Per test claim:  Combipatch is preferred by the insurance.  If suggested medication is appropriate, Please send in a new RX and discontinue this one. If not, please advise as to why it's not appropriate so that we may request a Prior Authorization. Please note, some preferred medications may still require a PA.  If the suggested medications have not been trialed and there are no contraindications to their use, the PA will not be submitted, as it will not be approved.  *this is a step therapy drug, insurance is requiring combipatch to be tried in the last 120 days and I do not see where she has tried this

## 2024-08-10 ENCOUNTER — Other Ambulatory Visit (HOSPITAL_COMMUNITY): Payer: Self-pay

## 2024-08-11 ENCOUNTER — Ambulatory Visit
Admission: RE | Admit: 2024-08-11 | Discharge: 2024-08-11 | Disposition: A | Discharge status: Home / Self Care | Source: Ambulatory Visit | Attending: Internal Medicine | Admitting: Internal Medicine

## 2024-08-11 DIAGNOSIS — Z1231 Encounter for screening mammogram for malignant neoplasm of breast: Secondary | ICD-10-CM | POA: Diagnosis not present

## 2024-08-16 ENCOUNTER — Other Ambulatory Visit: Payer: Self-pay | Admitting: Internal Medicine

## 2024-08-16 DIAGNOSIS — R928 Other abnormal and inconclusive findings on diagnostic imaging of breast: Secondary | ICD-10-CM

## 2024-08-24 ENCOUNTER — Telehealth: Admitting: Physician Assistant

## 2024-08-24 DIAGNOSIS — R3989 Other symptoms and signs involving the genitourinary system: Secondary | ICD-10-CM

## 2024-08-24 MED ORDER — CEPHALEXIN 500 MG PO CAPS: 500.0000 mg | ORAL_CAPSULE | Freq: Two times a day (BID) | ORAL | 0 refills | Status: AC

## 2024-08-24 NOTE — Progress Notes (Signed)

## 2024-08-26 ENCOUNTER — Ambulatory Visit
Admission: RE | Admit: 2024-08-26 | Discharge: 2024-08-26 | Disposition: A | Discharge status: Home / Self Care | Source: Ambulatory Visit | Attending: Internal Medicine | Admitting: Internal Medicine

## 2024-08-26 DIAGNOSIS — N6311 Unspecified lump in the right breast, upper outer quadrant: Secondary | ICD-10-CM | POA: Diagnosis not present

## 2024-08-26 DIAGNOSIS — R928 Other abnormal and inconclusive findings on diagnostic imaging of breast: Secondary | ICD-10-CM

## 2024-08-28 ENCOUNTER — Other Ambulatory Visit: Payer: Self-pay | Admitting: Obstetrics & Gynecology

## 2024-08-29 ENCOUNTER — Other Ambulatory Visit: Payer: Self-pay | Admitting: Obstetrics & Gynecology

## 2024-08-29 MED ORDER — ESTRADIOL-NORETHINDRONE ACET 0.05-0.14 MG/DAY TD PTTW
1.0000 | MEDICATED_PATCH | TRANSDERMAL | 12 refills | Status: AC
  Filled 2024-08-31: qty 8, 28d supply, fill #0
  Filled 2024-09-19 – 2024-09-22 (×2): qty 8, 28d supply, fill #1
  Filled 2024-10-22: qty 8, 28d supply, fill #2

## 2024-08-29 NOTE — Progress Notes (Signed)
 I prescribed combipatch to see if it helps her hot flashes more than climarapro.

## 2024-08-30 ENCOUNTER — Other Ambulatory Visit (HOSPITAL_COMMUNITY): Payer: Self-pay

## 2024-08-31 ENCOUNTER — Other Ambulatory Visit (HOSPITAL_COMMUNITY): Payer: Self-pay

## 2024-08-31 ENCOUNTER — Other Ambulatory Visit: Payer: Self-pay

## 2024-09-01 ENCOUNTER — Encounter (HOSPITAL_COMMUNITY): Payer: Self-pay

## 2024-09-04 ENCOUNTER — Other Ambulatory Visit (HOSPITAL_COMMUNITY): Payer: Self-pay

## 2024-09-05 ENCOUNTER — Other Ambulatory Visit (HOSPITAL_COMMUNITY): Payer: Self-pay

## 2024-09-06 ENCOUNTER — Other Ambulatory Visit (HOSPITAL_COMMUNITY): Payer: Self-pay

## 2024-09-19 ENCOUNTER — Other Ambulatory Visit: Payer: Self-pay | Admitting: Physician Assistant

## 2024-09-20 ENCOUNTER — Other Ambulatory Visit: Payer: Self-pay

## 2024-09-22 ENCOUNTER — Other Ambulatory Visit: Payer: Self-pay

## 2024-10-16 ENCOUNTER — Other Ambulatory Visit: Payer: Self-pay | Admitting: Physician Assistant

## 2024-10-16 ENCOUNTER — Other Ambulatory Visit: Payer: Self-pay

## 2024-10-18 ENCOUNTER — Other Ambulatory Visit: Payer: Self-pay

## 2024-10-22 ENCOUNTER — Other Ambulatory Visit: Payer: Self-pay | Admitting: Physician Assistant

## 2024-10-22 ENCOUNTER — Other Ambulatory Visit: Payer: Self-pay | Admitting: Obstetrics & Gynecology

## 2024-10-22 ENCOUNTER — Other Ambulatory Visit (HOSPITAL_COMMUNITY): Payer: Self-pay

## 2024-10-23 ENCOUNTER — Other Ambulatory Visit: Payer: Self-pay

## 2024-10-27 ENCOUNTER — Other Ambulatory Visit: Payer: Self-pay

## 2024-10-27 ENCOUNTER — Other Ambulatory Visit (HOSPITAL_COMMUNITY): Payer: Self-pay

## 2024-10-31 ENCOUNTER — Other Ambulatory Visit (HOSPITAL_COMMUNITY): Payer: Self-pay

## 2024-10-31 ENCOUNTER — Encounter: Payer: Self-pay | Admitting: Certified Nurse Midwife

## 2024-10-31 ENCOUNTER — Other Ambulatory Visit: Payer: Self-pay

## 2024-10-31 DIAGNOSIS — N951 Menopausal and female climacteric states: Secondary | ICD-10-CM

## 2024-10-31 MED ORDER — OMEPRAZOLE 40 MG PO CPDR
DELAYED_RELEASE_CAPSULE | ORAL | 3 refills | Status: AC
  Filled 2024-10-31: qty 90, 90d supply, fill #0

## 2024-10-31 MED ORDER — ESTRADIOL 0.01 % VA CREA
TOPICAL_CREAM | VAGINAL | 10 refills | Status: AC
  Filled 2024-10-31: qty 42.5, 90d supply, fill #0

## 2024-10-31 NOTE — Telephone Encounter (Signed)
 Pt says pharmacy unable to fill rx now despite there being refills on it.  When attempting to refill now, order was flagged as invalid.  New order placed today.

## 2024-11-01 ENCOUNTER — Other Ambulatory Visit (HOSPITAL_COMMUNITY): Payer: Self-pay

## 2024-11-01 ENCOUNTER — Other Ambulatory Visit: Payer: Self-pay

## 2024-11-02 ENCOUNTER — Other Ambulatory Visit: Payer: Self-pay

## 2024-11-08 ENCOUNTER — Encounter: Payer: Self-pay | Admitting: Dermatology

## 2024-11-08 ENCOUNTER — Ambulatory Visit (INDEPENDENT_AMBULATORY_CARE_PROVIDER_SITE_OTHER): Payer: Self-pay | Admitting: Dermatology

## 2024-11-08 DIAGNOSIS — D1801 Hemangioma of skin and subcutaneous tissue: Secondary | ICD-10-CM

## 2024-11-08 NOTE — Progress Notes (Signed)
" ° °  Follow-Up Visit   Subjective  Shelley Davila is a 54 y.o. female who presents for the following: patient here for BBL to treat spots on body and face today  The following portions of the chart were reviewed this encounter and updated as appropriate: medications, allergies, medical history  Review of Systems:  No other skin or systemic complaints except as noted in HPI or Assessment and Plan.  Objective  Well appearing patient in no apparent distress; mood and affect are within normal limits.   A focused examination was performed of the following areas: Face, abdomen, arms, chest, back   Relevant exam findings are noted in the Assessment and Plan. BBL before photos for hemangiomas at face, chest, shoulders, abdomen                       BBL for Hemangiomas at forehead, right cheek, chest, arms, abdomen, back and b/l flank     Assessment & Plan   HEMANGIOMA Exam: red papule(s) at chest, abdomen, flank b/l, back  arms, forehead , right upper eyelid  Discussed benign nature. Recommend observation. Call for changes. BBL today  Laser safety: Patient was advised in laser safety.  Patient was fitted with laser safety goggles and advised to keep eyes closed during procedure with goggles on. Staff and provider ensured that patient and their own safety goggles were also on and eyes protected during procedure. Laser room door was secured and locked from the inside. Laser room door has laser safety sign affixed to the outside of the door.    [place] BBL - 11/08/24 1400      Patient Details   Skin Type: I    Photo Takes: Yes    Consent Signed: Yes      Treatment Details   Date: 11/08/24    Treatment #: 1    Area: chest, abdomen back, arms, flanks , forehead, right upper eyelid    Filter: 1st Pass      1st Pass   Location: Other   hemangiomas chest, abdomen, back, arms, flanks,  forehead and right upper eyelid   Device: 560   hemangiomas at chest, abdomen,  arms, flanks, back, right  upper eyelid and forehead   BBL j/cm2: 25    PW Msec Sec: 20    Target Temp: 20    Pulses: 136    7mm: this one     Patient tolerated the procedure well.   [place] avoidance was stressed. The patient will call with any problems, questions or concerns prior to their next appointment.     Return if symptoms worsen or fail to improve.  IEleanor Blush, CMA, am acting as scribe for Alm Rhyme, MD.   Documentation: I have reviewed the above documentation for accuracy and completeness, and I agree with the above.  Alm Rhyme, MD    "

## 2024-11-08 NOTE — Patient Instructions (Signed)

## 2025-01-08 ENCOUNTER — Ambulatory Visit: Admitting: Dermatology
